# Patient Record
Sex: Male | Born: 1990 | Race: White | Hispanic: No | Marital: Single | Smoking: Current every day smoker
Health system: Southern US, Community
[De-identification: ages and names within clinical notes are randomized; demographics above are authoritative.]

## PROBLEM LIST (undated history)

## (undated) DIAGNOSIS — D62 Acute posthemorrhagic anemia: Secondary | ICD-10-CM

---
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
## 2013-09-18 ENCOUNTER — Inpatient Hospital Stay (HOSPITAL_COMMUNITY)
Admission: EM | Admit: 2013-09-18 | Discharge: 2013-09-21 | DRG: 037 | Disposition: A | Discharge status: Home / Self Care | Payer: PRIVATE HEALTH INSURANCE | Attending: Surgery | Admitting: Surgery

## 2013-09-18 ENCOUNTER — Encounter (HOSPITAL_COMMUNITY): Payer: Self-pay | Admitting: Emergency Medicine

## 2013-09-18 DIAGNOSIS — S45119A Laceration of brachial artery, unspecified side, initial encounter: Secondary | ICD-10-CM

## 2013-09-18 DIAGNOSIS — S4450XA Injury of cutaneous sensory nerve at shoulder and upper arm level, unspecified arm, initial encounter: Principal | ICD-10-CM | POA: Diagnosis present

## 2013-09-18 DIAGNOSIS — IMO0002 Reserved for concepts with insufficient information to code with codable children: Secondary | ICD-10-CM | POA: Diagnosis present

## 2013-09-18 DIAGNOSIS — W268XXA Contact with other sharp object(s), not elsewhere classified, initial encounter: Secondary | ICD-10-CM | POA: Diagnosis present

## 2013-09-18 DIAGNOSIS — S41119A Laceration without foreign body of unspecified upper arm, initial encounter: Secondary | ICD-10-CM

## 2013-09-18 DIAGNOSIS — S41111A Laceration without foreign body of right upper arm, initial encounter: Secondary | ICD-10-CM

## 2013-09-18 DIAGNOSIS — D62 Acute posthemorrhagic anemia: Secondary | ICD-10-CM | POA: Diagnosis present

## 2013-09-18 DIAGNOSIS — Y92009 Unspecified place in unspecified non-institutional (private) residence as the place of occurrence of the external cause: Secondary | ICD-10-CM

## 2013-09-18 DIAGNOSIS — T794XXA Traumatic shock, initial encounter: Secondary | ICD-10-CM | POA: Diagnosis present

## 2013-09-18 DIAGNOSIS — S51809A Unspecified open wound of unspecified forearm, initial encounter: Secondary | ICD-10-CM | POA: Diagnosis present

## 2013-09-18 DIAGNOSIS — F17200 Nicotine dependence, unspecified, uncomplicated: Secondary | ICD-10-CM | POA: Diagnosis present

## 2013-09-18 DIAGNOSIS — D500 Iron deficiency anemia secondary to blood loss (chronic): Secondary | ICD-10-CM | POA: Diagnosis present

## 2013-09-18 HISTORY — DX: Acute posthemorrhagic anemia: D62

## 2013-09-18 NOTE — ED Notes (Signed)
Per ems-- pt put arm through glass door by accident. Pt with 3 large lacerations to R arm. Bleeding controlled with tourniquet.  Pt admits to heavy etoh today. Initial bp 70 palpated. 750cc NS administered pta

## 2013-09-19 ENCOUNTER — Emergency Department (HOSPITAL_COMMUNITY): Payer: Self-pay

## 2013-09-19 ENCOUNTER — Observation Stay (HOSPITAL_COMMUNITY): Payer: PRIVATE HEALTH INSURANCE | Admitting: Anesthesiology

## 2013-09-19 ENCOUNTER — Emergency Department (HOSPITAL_COMMUNITY): Payer: PRIVATE HEALTH INSURANCE

## 2013-09-19 ENCOUNTER — Encounter (HOSPITAL_COMMUNITY): Payer: Self-pay | Admitting: Anesthesiology

## 2013-09-19 ENCOUNTER — Encounter (HOSPITAL_COMMUNITY)
Admission: EM | Disposition: A | Discharge status: Home / Self Care | Payer: PRIVATE HEALTH INSURANCE | Source: Home / Self Care

## 2013-09-19 DIAGNOSIS — S45909A Unspecified injury of unspecified blood vessel at shoulder and upper arm level, unspecified arm, initial encounter: Secondary | ICD-10-CM

## 2013-09-19 DIAGNOSIS — S6990XA Unspecified injury of unspecified wrist, hand and finger(s), initial encounter: Secondary | ICD-10-CM

## 2013-09-19 DIAGNOSIS — IMO0002 Reserved for concepts with insufficient information to code with codable children: Secondary | ICD-10-CM

## 2013-09-19 DIAGNOSIS — S41119A Laceration without foreign body of unspecified upper arm, initial encounter: Secondary | ICD-10-CM

## 2013-09-19 DIAGNOSIS — S59909A Unspecified injury of unspecified elbow, initial encounter: Secondary | ICD-10-CM

## 2013-09-19 DIAGNOSIS — S59919A Unspecified injury of unspecified forearm, initial encounter: Secondary | ICD-10-CM

## 2013-09-19 DIAGNOSIS — S55109A Unspecified injury of radial artery at forearm level, unspecified arm, initial encounter: Secondary | ICD-10-CM

## 2013-09-19 HISTORY — PX: FASCIOTOMY: SHX132

## 2013-09-19 HISTORY — PX: ARTERY REPAIR: SHX559

## 2013-09-19 LAB — PREPARE FRESH FROZEN PLASMA
UNIT DIVISION: 0
Unit division: 0

## 2013-09-19 LAB — CBC
HCT: 29.9 % — ABNORMAL LOW (ref 39.0–52.0)
HEMATOCRIT: 28.8 % — AB (ref 39.0–52.0)
HEMOGLOBIN: 9.8 g/dL — AB (ref 13.0–17.0)
Hemoglobin: 9.8 g/dL — ABNORMAL LOW (ref 13.0–17.0)
MCH: 32 pg (ref 26.0–34.0)
MCH: 33 pg (ref 26.0–34.0)
MCHC: 32.8 g/dL (ref 30.0–36.0)
MCHC: 34 g/dL (ref 30.0–36.0)
MCV: 100.7 fL — ABNORMAL HIGH (ref 78.0–100.0)
MCV: 94.1 fL (ref 78.0–100.0)
PLATELETS: 113 10*3/uL — AB (ref 150–400)
Platelets: 175 10*3/uL (ref 150–400)
RBC: 2.97 MIL/uL — AB (ref 4.22–5.81)
RBC: 3.06 MIL/uL — ABNORMAL LOW (ref 4.22–5.81)
RDW: 12.3 % (ref 11.5–15.5)
RDW: 14.4 % (ref 11.5–15.5)
WBC: 10.2 10*3/uL (ref 4.0–10.5)
WBC: 15.4 10*3/uL — ABNORMAL HIGH (ref 4.0–10.5)

## 2013-09-19 LAB — POCT I-STAT EG7
ACID-BASE DEFICIT: 8 mmol/L — AB (ref 0.0–2.0)
BICARBONATE: 19.6 meq/L — AB (ref 20.0–24.0)
Calcium, Ion: 1.04 mmol/L — ABNORMAL LOW (ref 1.12–1.23)
HCT: 26 % — ABNORMAL LOW (ref 39.0–52.0)
HEMOGLOBIN: 8.8 g/dL — AB (ref 13.0–17.0)
O2 Saturation: 83 %
Patient temperature: 36
Potassium: 4.6 mEq/L (ref 3.7–5.3)
Sodium: 140 mEq/L (ref 137–147)
TCO2: 21 mmol/L (ref 0–100)
pCO2, Ven: 47.4 mmHg (ref 45.0–50.0)
pH, Ven: 7.218 — ABNORMAL LOW (ref 7.250–7.300)
pO2, Ven: 54 mmHg — ABNORMAL HIGH (ref 30.0–45.0)

## 2013-09-19 LAB — BLOOD PRODUCT ORDER (VERBAL) VERIFICATION

## 2013-09-19 LAB — COMPREHENSIVE METABOLIC PANEL
ALT: 13 U/L (ref 0–53)
AST: 18 U/L (ref 0–37)
Albumin: 2.9 g/dL — ABNORMAL LOW (ref 3.5–5.2)
Alkaline Phosphatase: 71 U/L (ref 39–117)
Anion gap: 17 — ABNORMAL HIGH (ref 5–15)
BUN: 9 mg/dL (ref 6–23)
CALCIUM: 7.1 mg/dL — AB (ref 8.4–10.5)
CO2: 19 mEq/L (ref 19–32)
Chloride: 108 mEq/L (ref 96–112)
Creatinine, Ser: 0.9 mg/dL (ref 0.50–1.35)
GFR calc non Af Amer: >90 mL/min (ref >90)
GLUCOSE: 105 mg/dL — AB (ref 70–99)
Potassium: 3.5 mEq/L — ABNORMAL LOW (ref 3.7–5.3)
SODIUM: 144 meq/L (ref 137–147)
Total Bilirubin: <0.2 mg/dL — ABNORMAL LOW (ref 0.3–1.2)
Total Protein: 5.2 g/dL — ABNORMAL LOW (ref 6.0–8.3)

## 2013-09-19 LAB — PROTIME-INR
INR: 1.09 (ref 0.00–1.49)
INR: 1.6 — ABNORMAL HIGH (ref 0.00–1.49)
PROTHROMBIN TIME: 19.1 s — AB (ref 11.6–15.2)
Prothrombin Time: 14.1 seconds (ref 11.6–15.2)

## 2013-09-19 LAB — APTT: aPTT: 33 seconds (ref 24–37)

## 2013-09-19 LAB — PREPARE RBC (CROSSMATCH)

## 2013-09-19 LAB — ETHANOL: ALCOHOL ETHYL (B): 283 mg/dL — AB (ref 0–11)

## 2013-09-19 LAB — MRSA PCR SCREENING: MRSA BY PCR: NEGATIVE

## 2013-09-19 LAB — ABO/RH: ABO/RH(D): O POS

## 2013-09-19 SURGERY — REPAIR, ARTERY, BRACHIAL
Anesthesia: General | Site: Arm Lower | Laterality: Right

## 2013-09-19 MED ORDER — FENTANYL CITRATE 0.05 MG/ML IJ SOLN
INTRAMUSCULAR | Status: AC
  Filled 2013-09-19: qty 5

## 2013-09-19 MED ORDER — DEXTROSE IN LACTATED RINGERS 5 % IV SOLN
INTRAVENOUS | Status: DC
  Administered 2013-09-19: 07:00:00 via INTRAVENOUS

## 2013-09-19 MED ORDER — ARTIFICIAL TEARS OP OINT
TOPICAL_OINTMENT | OPHTHALMIC | Status: DC | PRN
  Administered 2013-09-19: 1 via OPHTHALMIC

## 2013-09-19 MED ORDER — ONDANSETRON HCL 4 MG PO TABS: 4.0000 mg | ORAL_TABLET | Freq: Four times a day (QID) | ORAL | Status: DC | PRN

## 2013-09-19 MED ORDER — SUCCINYLCHOLINE CHLORIDE 20 MG/ML IJ SOLN
INTRAMUSCULAR | Status: DC | PRN
  Administered 2013-09-19: 180 mg via INTRAVENOUS

## 2013-09-19 MED ORDER — ONDANSETRON HCL 4 MG/2ML IJ SOLN
INTRAMUSCULAR | Status: AC
  Filled 2013-09-19: qty 2

## 2013-09-19 MED ORDER — KETOROLAC TROMETHAMINE 30 MG/ML IJ SOLN
30.0000 mg | Freq: Once | INTRAMUSCULAR | Status: AC
  Administered 2013-09-19: 30 mg via INTRAVENOUS
  Filled 2013-09-19: qty 1

## 2013-09-19 MED ORDER — FENTANYL CITRATE 0.05 MG/ML IJ SOLN
INTRAMUSCULAR | Status: DC | PRN
  Administered 2013-09-19 (×5): 50 ug via INTRAVENOUS

## 2013-09-19 MED ORDER — SODIUM CHLORIDE 0.9 % IV SOLN
INTRAVENOUS | Status: AC | PRN
  Administered 2013-09-19: 1000 mL via INTRAVENOUS

## 2013-09-19 MED ORDER — ONDANSETRON HCL 4 MG/2ML IJ SOLN
4.0000 mg | Freq: Four times a day (QID) | INTRAMUSCULAR | Status: DC | PRN
  Administered 2013-09-19 (×2): 4 mg via INTRAVENOUS
  Filled 2013-09-19 (×3): qty 2

## 2013-09-19 MED ORDER — ENOXAPARIN SODIUM 40 MG/0.4ML ~~LOC~~ SOLN
40.0000 mg | SUBCUTANEOUS | Status: DC
  Administered 2013-09-19 – 2013-09-20 (×2): 40 mg via SUBCUTANEOUS
  Filled 2013-09-19 (×4): qty 0.4

## 2013-09-19 MED ORDER — PAPAVERINE HCL 30 MG/ML IJ SOLN
INTRAMUSCULAR | Status: DC | PRN
Start: 2013-09-19 — End: 2013-09-19
  Administered 2013-09-19: 2 mL via INTRAVENOUS

## 2013-09-19 MED ORDER — OXYCODONE HCL 5 MG PO TABS: 5.0000 mg | ORAL_TABLET | Freq: Once | ORAL | Status: DC | PRN

## 2013-09-19 MED ORDER — HYDROMORPHONE HCL PF 1 MG/ML IJ SOLN: 0.2500 mg | INTRAMUSCULAR | Status: DC | PRN

## 2013-09-19 MED ORDER — PHENYLEPHRINE HCL 10 MG/ML IJ SOLN
INTRAMUSCULAR | Status: DC | PRN
  Administered 2013-09-19 (×7): 40 ug via INTRAVENOUS

## 2013-09-19 MED ORDER — HYDROMORPHONE HCL PF 1 MG/ML IJ SOLN
1.0000 mg | INTRAMUSCULAR | Status: DC | PRN
  Administered 2013-09-19: 2 mg via INTRAVENOUS
  Administered 2013-09-21: 1 mg via INTRAVENOUS
  Filled 2013-09-19 (×2): qty 1
  Filled 2013-09-19: qty 2

## 2013-09-19 MED ORDER — HYDROMORPHONE HCL PF 1 MG/ML IJ SOLN
1.0000 mg | INTRAMUSCULAR | Status: DC | PRN
  Administered 2013-09-19 (×2): 1 mg via INTRAVENOUS
  Filled 2013-09-19 (×3): qty 1

## 2013-09-19 MED ORDER — ONDANSETRON HCL 4 MG/2ML IJ SOLN: 4.0000 mg | Freq: Once | INTRAMUSCULAR | Status: DC | PRN

## 2013-09-19 MED ORDER — MIDAZOLAM HCL 5 MG/5ML IJ SOLN
INTRAMUSCULAR | Status: DC | PRN
  Administered 2013-09-19: 2 mg via INTRAVENOUS

## 2013-09-19 MED ORDER — SODIUM CHLORIDE 0.9 % IV BOLUS (SEPSIS): 1000.0000 mL | Freq: Once | INTRAVENOUS | Status: DC

## 2013-09-19 MED ORDER — MIDAZOLAM HCL 2 MG/2ML IJ SOLN
INTRAMUSCULAR | Status: AC
  Filled 2013-09-19: qty 2

## 2013-09-19 MED ORDER — ALBUMIN HUMAN 5 % IV SOLN
INTRAVENOUS | Status: DC | PRN
  Administered 2013-09-19 (×2): via INTRAVENOUS

## 2013-09-19 MED ORDER — PROPOFOL 10 MG/ML IV BOLUS
INTRAVENOUS | Status: AC
  Filled 2013-09-19: qty 20

## 2013-09-19 MED ORDER — THROMBIN 20000 UNITS EX SOLR
CUTANEOUS | Status: AC
  Filled 2013-09-19: qty 20000

## 2013-09-19 MED ORDER — OXYCODONE-ACETAMINOPHEN 5-325 MG PO TABS
1.0000 | ORAL_TABLET | ORAL | Status: DC | PRN
  Administered 2013-09-19: 2 via ORAL
  Filled 2013-09-19 (×2): qty 2

## 2013-09-19 MED ORDER — LACTATED RINGERS IV SOLN
INTRAVENOUS | Status: DC | PRN
  Administered 2013-09-19 (×3): via INTRAVENOUS

## 2013-09-19 MED ORDER — CEFAZOLIN SODIUM-DEXTROSE 2-3 GM-% IV SOLR
INTRAVENOUS | Status: DC | PRN
  Administered 2013-09-19: 2 g via INTRAVENOUS

## 2013-09-19 MED ORDER — GELATIN ABSORBABLE 100 EX MISC
CUTANEOUS | Status: DC | PRN
  Administered 2013-09-19: 02:00:00 via TOPICAL

## 2013-09-19 MED ORDER — ESMOLOL HCL 10 MG/ML IV SOLN
INTRAVENOUS | Status: DC | PRN
  Administered 2013-09-19: 20 mg via INTRAVENOUS

## 2013-09-19 MED ORDER — SUCCINYLCHOLINE CHLORIDE 20 MG/ML IJ SOLN
INTRAMUSCULAR | Status: AC
  Filled 2013-09-19: qty 1

## 2013-09-19 MED ORDER — OXYCODONE HCL 5 MG/5ML PO SOLN: 5.0000 mg | Freq: Once | ORAL | Status: DC | PRN

## 2013-09-19 MED ORDER — ACETAMINOPHEN 325 MG PO TABS
650.0000 mg | ORAL_TABLET | Freq: Four times a day (QID) | ORAL | Status: DC | PRN
  Administered 2013-09-19 – 2013-09-20 (×3): 650 mg via ORAL
  Filled 2013-09-19 (×2): qty 2

## 2013-09-19 MED ORDER — PAPAVERINE HCL 30 MG/ML IJ SOLN
INTRAMUSCULAR | Status: AC
  Filled 2013-09-19: qty 2

## 2013-09-19 MED ORDER — SODIUM CHLORIDE 0.9 % IV SOLN
INTRAVENOUS | Status: DC | PRN
  Administered 2013-09-19: 02:00:00 via INTRAVENOUS

## 2013-09-19 MED ORDER — CEFAZOLIN SODIUM-DEXTROSE 2-3 GM-% IV SOLR
INTRAVENOUS | Status: AC
  Filled 2013-09-19: qty 50

## 2013-09-19 MED ORDER — ALBUTEROL SULFATE HFA 108 (90 BASE) MCG/ACT IN AERS
INHALATION_SPRAY | RESPIRATORY_TRACT | Status: DC | PRN
  Administered 2013-09-19 (×2): 2 via RESPIRATORY_TRACT

## 2013-09-19 MED ORDER — SODIUM CHLORIDE 0.9 % IV SOLN
INTRAVENOUS | Status: AC | PRN
  Administered 2013-09-19 (×2): 1000 mL via INTRAVENOUS

## 2013-09-19 MED ORDER — SODIUM CHLORIDE 0.9 % IR SOLN
Status: DC | PRN
  Administered 2013-09-19: 02:00:00

## 2013-09-19 MED ORDER — SODIUM CHLORIDE 0.9 % IJ SOLN
INTRAMUSCULAR | Status: AC
  Filled 2013-09-19: qty 10

## 2013-09-19 MED ORDER — OXYCODONE HCL 5 MG PO TABS
5.0000 mg | ORAL_TABLET | ORAL | Status: DC | PRN
  Administered 2013-09-19: 5 mg via ORAL
  Administered 2013-09-19 (×2): 10 mg via ORAL
  Administered 2013-09-20: 5 mg via ORAL
  Administered 2013-09-21: 10 mg via ORAL
  Filled 2013-09-19: qty 2
  Filled 2013-09-19: qty 1
  Filled 2013-09-19 (×3): qty 2

## 2013-09-19 MED ORDER — ESMOLOL HCL 10 MG/ML IV SOLN
INTRAVENOUS | Status: AC
  Filled 2013-09-19: qty 10

## 2013-09-19 MED ORDER — ONDANSETRON HCL 4 MG/2ML IJ SOLN
INTRAMUSCULAR | Status: DC | PRN
  Administered 2013-09-19: 4 mg via INTRAVENOUS

## 2013-09-19 MED ORDER — BIOTENE DRY MOUTH MT LIQD
15.0000 mL | Freq: Two times a day (BID) | OROMUCOSAL | Status: DC
  Administered 2013-09-19 – 2013-09-20 (×2): 15 mL via OROMUCOSAL

## 2013-09-19 MED ORDER — SODIUM CHLORIDE 0.9 % IR SOLN
Status: DC | PRN
Start: 2013-09-19 — End: 2013-09-19
  Administered 2013-09-19: 1000 mL

## 2013-09-19 MED ORDER — ETOMIDATE 2 MG/ML IV SOLN
INTRAVENOUS | Status: DC | PRN
  Administered 2013-09-19: 18 mg via INTRAVENOUS

## 2013-09-19 MED ORDER — KETOROLAC TROMETHAMINE 15 MG/ML IJ SOLN
15.0000 mg | Freq: Four times a day (QID) | INTRAMUSCULAR | Status: DC
  Administered 2013-09-19 – 2013-09-20 (×6): 15 mg via INTRAVENOUS
  Filled 2013-09-19 (×16): qty 1

## 2013-09-19 MED ORDER — LIDOCAINE HCL (CARDIAC) 20 MG/ML IV SOLN
INTRAVENOUS | Status: AC
  Filled 2013-09-19: qty 5

## 2013-09-19 MED ORDER — PHENYLEPHRINE 40 MCG/ML (10ML) SYRINGE FOR IV PUSH (FOR BLOOD PRESSURE SUPPORT)
PREFILLED_SYRINGE | INTRAVENOUS | Status: AC
  Filled 2013-09-19: qty 10

## 2013-09-19 MED ORDER — ETOMIDATE 2 MG/ML IV SOLN
INTRAVENOUS | Status: AC
  Filled 2013-09-19: qty 10

## 2013-09-19 SURGICAL SUPPLY — 62 items
BANDAGE ELASTIC 4 VELCRO ST LF (GAUZE/BANDAGES/DRESSINGS) ×4 IMPLANT
BLADE 10 SAFETY STRL DISP (BLADE) ×4 IMPLANT
BNDG ESMARK 4X9 LF (GAUZE/BANDAGES/DRESSINGS) IMPLANT
BNDG GAUZE ELAST 4 BULKY (GAUZE/BANDAGES/DRESSINGS) ×4 IMPLANT
CANISTER SUCTION 2500CC (MISCELLANEOUS) ×4 IMPLANT
CATH EMB 2FR 60CM (CATHETERS) ×4 IMPLANT
CLIP TI MEDIUM 6 (CLIP) ×4 IMPLANT
CLIP TI WIDE RED SMALL 6 (CLIP) ×8 IMPLANT
COVER SURGICAL LIGHT HANDLE (MISCELLANEOUS) ×4 IMPLANT
CUFF TOURNIQUET SINGLE 18IN (TOURNIQUET CUFF) IMPLANT
CUFF TOURNIQUET SINGLE 24IN (TOURNIQUET CUFF) IMPLANT
DECANTER SPIKE VIAL GLASS SM (MISCELLANEOUS) IMPLANT
DERMABOND ADVANCED (GAUZE/BANDAGES/DRESSINGS) ×2
DERMABOND ADVANCED .7 DNX12 (GAUZE/BANDAGES/DRESSINGS) ×2 IMPLANT
DRAPE ORTHO SPLIT 77X108 STRL (DRAPES) ×2
DRAPE SURG ORHT 6 SPLT 77X108 (DRAPES) ×2 IMPLANT
DRSG PAD ABDOMINAL 8X10 ST (GAUZE/BANDAGES/DRESSINGS) ×4 IMPLANT
ELECT REM PT RETURN 9FT ADLT (ELECTROSURGICAL) ×4
ELECTRODE REM PT RTRN 9FT ADLT (ELECTROSURGICAL) ×2 IMPLANT
GAUZE SPONGE 4X4 16PLY XRAY LF (GAUZE/BANDAGES/DRESSINGS) ×4 IMPLANT
GAUZE XEROFORM 5X9 LF (GAUZE/BANDAGES/DRESSINGS) ×4 IMPLANT
GLOVE BIO SURGEON STRL SZ7 (GLOVE) ×8 IMPLANT
GLOVE BIO SURGEON STRL SZ7.5 (GLOVE) ×8 IMPLANT
GLOVE BIOGEL PI IND STRL 7.5 (GLOVE) ×6 IMPLANT
GLOVE BIOGEL PI IND STRL 8 (GLOVE) ×2 IMPLANT
GLOVE BIOGEL PI INDICATOR 7.5 (GLOVE) ×6
GLOVE BIOGEL PI INDICATOR 8 (GLOVE) ×2
GLOVE BIOGEL PI ORTHO PRO SZ8 (GLOVE) ×2
GLOVE PI ORTHO PRO STRL SZ8 (GLOVE) ×2 IMPLANT
GOWN STRL REUS W/ TWL LRG LVL3 (GOWN DISPOSABLE) ×6 IMPLANT
GOWN STRL REUS W/TWL LRG LVL3 (GOWN DISPOSABLE) ×6
KIT BASIN OR (CUSTOM PROCEDURE TRAY) ×4 IMPLANT
KIT ROOM TURNOVER OR (KITS) ×4 IMPLANT
NS IRRIG 1000ML POUR BTL (IV SOLUTION) ×4 IMPLANT
PACK CV ACCESS (CUSTOM PROCEDURE TRAY) ×4 IMPLANT
PACK UNIVERSAL I (CUSTOM PROCEDURE TRAY) ×4 IMPLANT
PAD ARMBOARD 7.5X6 YLW CONV (MISCELLANEOUS) ×8 IMPLANT
PAD CAST 4YDX4 CTTN HI CHSV (CAST SUPPLIES) ×2 IMPLANT
PADDING CAST COTTON 4X4 STRL (CAST SUPPLIES) ×2
SPLINT PLASTER CAST XFAST 5X30 (CAST SUPPLIES) ×2 IMPLANT
SPLINT PLASTER XFAST SET 5X30 (CAST SUPPLIES) ×2
SPONGE GAUZE 4X4 12PLY (GAUZE/BANDAGES/DRESSINGS) ×4 IMPLANT
SPONGE LAP 18X18 X RAY DECT (DISPOSABLE) ×4 IMPLANT
SPONGE SURGIFOAM ABS GEL 100 (HEMOSTASIS) IMPLANT
STAPLER VISISTAT 35W (STAPLE) ×16 IMPLANT
SUT ETHILON 3 0 PS 1 (SUTURE) ×12 IMPLANT
SUT ETHILON 8 0 BV130 4 (SUTURE) ×4 IMPLANT
SUT MNCRL AB 4-0 PS2 18 (SUTURE) ×4 IMPLANT
SUT PROLENE 6 0 BV (SUTURE) ×4 IMPLANT
SUT PROLENE 7 0 BV1 MDA (SUTURE) ×4 IMPLANT
SUT SILK 3 0 SH 30 (SUTURE) ×4 IMPLANT
SUT VIC AB 2-0 CT1 27 (SUTURE) ×2
SUT VIC AB 2-0 CT1 TAPERPNT 27 (SUTURE) ×2 IMPLANT
SUT VIC AB 2-0 SH 27 (SUTURE) ×10
SUT VIC AB 2-0 SH 27XBRD (SUTURE) ×10 IMPLANT
SUT VIC AB 3-0 SH 27 (SUTURE) ×2
SUT VIC AB 3-0 SH 27X BRD (SUTURE) ×2 IMPLANT
SYR TB 1ML LUER SLIP (SYRINGE) ×4 IMPLANT
TOWEL OR 17X24 6PK STRL BLUE (TOWEL DISPOSABLE) ×8 IMPLANT
TOWEL OR 17X26 10 PK STRL BLUE (TOWEL DISPOSABLE) ×4 IMPLANT
UNDERPAD 30X30 INCONTINENT (UNDERPADS AND DIAPERS) ×4 IMPLANT
WATER STERILE IRR 1000ML POUR (IV SOLUTION) ×4 IMPLANT

## 2013-09-19 NOTE — Op Note (Addendum)
OPERATIVE NOTE   PROCEDURE: 1. Repair of right brachial artery with interposition graft of reversed cephalic vein 2. Ligation of right cephalic vein, brachial veins, and basilic vein 3. Patch angioplasty of radial artery with cephalic vein 4. Complex repair of lacerations of right arm (~10 cm)  PRE-OPERATIVE DIAGNOSIS: traumatic laceration of antecubitum, hemorrhagic shock  POST-OPERATIVE DIAGNOSIS: same as above   SURGEON: Leonides Sake, MD  ASSISTANT(S): Betha Loa, MD  ANESTHESIA: general  ESTIMATED BLOOD LOSS: 200 cc  FINDING(S): 1. Transection of brachial artery and veins 2. Partial transection of anterior wall of radial artery 3. Transection of cephalic and basilic veins 4. Transection of lateral cutaneous nerve 5. Intact collateral flow in forearm 6. Faintly palpable radial pulse at end of case 7. Dopplerable ulnar artery signal 8. Dopplerable palmar arch  SPECIMEN(S):  none  INDICATIONS:   [name] is a 23 y.o. male who presents with traumatic injury to right antecubitum after reportedly putting his arm through a glass door.  He reported was bleeding >500 mL in the ambulance and a tourniquet was applied.  The patient was brought back to the operating room emergently for a right arm exploration and possible repair of vessels, nerves, and arteries.  The patient is aware the risks of this procedure include but are not limited to:  bleeding, infection, inability to reconstruct arteries, nerves, and veins, and need for additional procedures in the future.  The patient elected to proceed forward with this case..  DESCRIPTION: After obtaining full informed written consent, the patient was brought back to the operating room.  Due to continued bleeding, I removed the EMS placed tourniquet and place a non-sterile tourniquet on the upper arm.  I inflated the tourniquet to 250 mm Hg, obtaining hemostasis in the right arm.  The patient was then placed supine upon the operating  table.  The patient received IV antibiotics prior to induction.  After obtaining adequate anesthesia, the patient was prepped and draped in the standard fashion for: right arm exploration.  This was a joint procedure with Dr. Merlyn Lot, Hand Surgery.  His portion of his case is dictated in his note.  I started by irrigating out all of the lacerations.  There was a deep laceration slightly proximal to the elbow joint that extended down to his bone.  There was also a more shallow laceration on the lateral surface which only extended down to the muscle.  There also a shallow laceration on the medial forearm which was only down to subcutaneous tissue.  Finally there was a forearm laceration which appeared to extend down to the forearm vessels.  I made an incision over the brachial artery proximal to the antecubital laceration.  I dissected out the transected brachial artery.   In the process, I identified two transected brachial veins, a transected cephalic vein, and a transection basilic vein.  I also dissected out the distal extent of the transected brachial artery.  Despite mobilization of the two ends of this artery, I did not think it could be repaired tension free without an interposition graft of vein.  The cephalic vein appeared to be larger in caliber so I elected to use this vein as the interposition graft.  I harvested a 10 cm length of cephalic vein from this arm as the vein was already transected.  I sharply freshen both ends of this vein conduit.  I inserted a vessel cannula and hydrodistended this vein segment.  It appeared to be 4-5 mm in diameter  without any evidence of sclerosis.  I elected to use this segment in a reversed configuration.  I sharply transected the proximal end of the brachial artery at an uninjured segment.  I spatulated the proximal brachial artery and the distal end of the cephalic vein conduit to facilitate an end-to-end anastomosis.  The vein was sewn to the brachial artery with a  running stitch of 7-0 Prolene.  Distally, I also transected the brachial artery at an uninjured segment.  I stretched the vein and the artery.  The entire residual length ~7 cm of conduit appeared to be necessary due to retraction of the artery.  I spatulated the proximal end of the cephalic vein conduit and the distal brachial artery to facilitate an end-to-end anastomosis.  I sewed the vein to the artery with a running stitch of 7-0 Prolene.  Prior to completing this anastomosis, I passed a 2 Fogarty proximally and distally: no thrombus was present.  The tourniquet was dropped and no clot was noted from either end of the distal anastomosis.  I completed this anastomosis in the usual fashion.  Immediately arterial bleeding from a forearm radial artery injury was was noted.  I reinflated the tourniquet and turned my attention to the forearm.  The forearm laceration was washed out again.  I dissected out the radial artery in the forearm laceration.  I could identify a transection of the anterior wall of the radial artery.  The lumen of this artery did not appear adequate to do a primary repair of the radial artery, so I took some the remaining cephalic vein and opened it longitudinally.  I cut the vein to a geometry compatible with the arterial injury.  I sewed the vein patch onto the radial artery with a running stitch of 7-0 Prolene.  Prior to completing this patch angioplasty, I backbled both ends of the radial artery.  I completed the patch angioplasty in the usual fashion.  I released all clamps and the tourniquet.  Immediately there was a palpable radial pulse.  I could also doppler an ulnar artery signal and a palmar arch signal.  I sequentially ligated all visible transected vein segments at this point, as there was inadequate length to repair any of them.    Dr. Merlyn Lot had been repairing the muscles and tendons and had started fasciotomies in this forearm, as documented in his operative note.  At this  point, I helped Dr. Merlyn Lot with completion of his ulnar and volar fasciotomies of the forearm.  I also assisted his nerve repair.  We washed out all incisions and placed thrombin and gelfoam.  After waiting 5 minutes, no further active bleeding was present.  We both reapproximated the subcutaneous tissue in the forearm with 3-0 Vicryl stitches.  The skin was reapproximated at critical junctures with 3-0 Nylon stitches.  The remaining skin was reapproximated with staples except for the short shallow laceration on the medial surface of the forearm which appeared somewhat contaminated.  The arm was washed off.  Xeroform gauze was applied to the staple line.  Sterile gauze and abdominal pads were applied to the right arm.  The arm was wrapped with a Kerlix from and to upper arm.  At this point, Dr. Merlyn Lot constructed a cushioned splint from the upper arm down to the wrist.  This was secured with a Kerlix and ACE wrap.  As the wrist was covered now, the wrist pulses could not be palpated.  Consequently, I marked a point of insonation in  the palmar arch.  COMPLICATIONS: none  CONDITION: guarded  Leonides SakeBrian Audryanna Zurita, MD [facility] Office: [phone] Pager: [phone]  09/19/2013, 4:15 AM

## 2013-09-19 NOTE — Progress Notes (Signed)
Attempt x1 to get report, RN off floor, will attempt again.

## 2013-09-19 NOTE — Brief Op Note (Signed)
09/18/2013 - 09/19/2013  4:21 AM  PATIENT:  [name]  23 y.o. male  PRE-OPERATIVE DIAGNOSIS:  Laceration right arm  POST-OPERATIVE DIAGNOSIS:  x2 Laceration right arm.  PROCEDURE:  Procedure(s) with comments: BRACHIAL ARTERY REPAIR (Right) - Brachial artery repair with interpositional graft and patch angioplasty. FASCIOTOMY (Right) - repair of cutaneous nerve and repair of right arm muscles, and fasciotomy x2 performed by Dr. Merlyn LotKuzma.  SURGEON:  Surgeon(s) and Role:    * Fransisco HertzBrian L Chen, MD - Primary Arterial repair    * Tami RibasKevin R Tamer Baughman, MD - Primary fasciotomy, muscle repair, nerve repair  PHYSICIAN ASSISTANT:   ASSISTANTS: none   ANESTHESIA:   general  EBL:  Total I/O In: 6010 [I.V.:4250; Blood:1260; IV Piggyback:500] Out: -   BLOOD ADMINISTERED:none  DRAINS: none   LOCAL MEDICATIONS USED:  NONE  SPECIMEN:  No Specimen  DISPOSITION OF SPECIMEN:  N/A  COUNTS:  YES  TOURNIQUET:   Total Tourniquet Time Documented: Upper Arm (Right) - 23 minutes Upper Arm (Right) - 66 minutes Total: Upper Arm (Right) - 89 minutes   DICTATION: .Other Dictation: Dictation Number [id number]  PLAN OF CARE: Discharge to home after PACU  PATIENT DISPOSITION:  PACU - hemodynamically stable.

## 2013-09-19 NOTE — Anesthesia Procedure Notes (Signed)
Procedure Name: Intubation Date/Time: 09/19/2013 12:53 AM Performed by: Luster LandsbergHASE, Lear Carstens R Pre-anesthesia Checklist: Patient identified, Emergency Drugs available, Suction available and Patient being monitored Patient Re-evaluated:Patient Re-evaluated prior to inductionOxygen Delivery Method: Circle system utilized Preoxygenation: Pre-oxygenation with 100% oxygen Intubation Type: IV induction, Rapid sequence and Cricoid Pressure applied Laryngoscope Size: Mac and 3 Grade View: Grade I Tube type: Oral Tube size: 7.5 mm Number of attempts: 1 Airway Equipment and Method: Stylet Placement Confirmation: ETT inserted through vocal cords under direct vision,  positive ETCO2 and breath sounds checked- equal and bilateral Secured at: 22 cm Tube secured with: Tape Dental Injury: Teeth and Oropharynx as per pre-operative assessment

## 2013-09-19 NOTE — Progress Notes (Signed)
   Palmar artery doppler signal intact.  He has increased AROM of the fingers, sensation not intact.  Senita Corredor MAUREEN PA-C

## 2013-09-19 NOTE — Progress Notes (Signed)
Trauma Service Note  Subjective: Lots of pain in right arm.  Objective: Vital signs in last 24 hours: Temp:  [97.4 F (36.3 C)-99.3 F (37.4 C)] 99.2 F (37.3 C) (07/17 0727) Pulse Rate:  [72-114] 75 (07/17 1000) Resp:  [8-24] 24 (07/17 1000) BP: (82-128)/(53-74) 117/60 mmHg (07/17 1000) SpO2:  [96 %-100 %] 96 % (07/17 1000) Weight:  [68.04 kg (150 lb)] 68.04 kg (150 lb) (07/16 2353) Last BM Date: 09/18/13  Intake/Output from previous day: 07/16 0701 - 07/17 0700 In: 7057.5 [I.V.:5285; Blood:1272.5; IV Piggyback:500] Out: 1250 [Urine:1000; Blood:250] Intake/Output this shift: Total I/O In: 495 [I.V.:300; Blood:195] Out: -   General: Moderated acute distress  Lungs: Clear  Abd: Benign  Extremities: Re-wrapped RUE.  Some bleeding.  Good arch doppler signal in right hand.  Neuro: Intact  Lab Results: CBC   Recent Labs  09/19/13 0015 09/19/13 0329 09/19/13 0330  WBC 10.2  --  15.4*  HGB 9.8* 8.8* 9.8*  HCT 29.9* 26.0* 28.8*  PLT 175  --  113*   BMET  Recent Labs  09/19/13 0015 09/19/13 0329  NA 144 140  K 3.5* 4.6  CL 108  --   CO2 19  --   GLUCOSE 105*  --   BUN 9  --   CREATININE 0.90  --   CALCIUM 7.1*  --    PT/INR  Recent Labs  09/19/13 0015 09/19/13 0330  LABPROT 14.1 19.1*  INR 1.09 1.60*   ABG  Recent Labs  09/19/13 0329  HCO3 19.6*    Studies/Results: Dg Elbow 2 Views Right  09/19/2013   CLINICAL DATA:  Arterial bleeding from the right arm after trauma.  EXAM: RIGHT ELBOW - 1 VIEW  COMPARISON:  None.  FINDINGS: Extensive subcutaneous gas in the forearm and distal arm, with overlying bandage. There is no radiopaque foreign body, dislocation or fracture in the frontal projection.  IMPRESSION: No fracture or definite foreign body in the frontal projection.   Electronically Signed   By: Tiburcio PeaJonathan  Watts M.D.   On: 09/19/2013 00:36    Anti-infectives: Anti-infectives   None      Assessment/Plan: s/p Procedure(s): BRACHIAL  ARTERY REPAIR FASCIOTOMY Advance diet Transfer to floor  LOS: 1 day   Marta LamasJames O. Gae BonWyatt, III, MD, FACS [phone] Trauma Surgeon 09/19/2013

## 2013-09-19 NOTE — Transfer of Care (Signed)
Immediate Anesthesia Transfer of Care Note  Patient: [name]  Procedure(s) Performed: Procedure(s) with comments: BRACHIAL ARTERY REPAIR (Right) - Brachial artery repair with interpositional graft and patch angioplasty. FASCIOTOMY (Right) - repair of cutaneous nerve and repair of right arm muscles, and fasciotomy x2 performed by Dr. Merlyn LotKuzma.  Patient Location: PACU  Anesthesia Type:General  Level of Consciousness: responds to stimulation  Airway & Oxygen Therapy: Patient Spontanous Breathing and Patient connected to nasal cannula oxygen  Post-op Assessment: Report given to PACU RN, Post -op Vital signs reviewed and stable and Patient moving all extremities  Post vital signs: Reviewed and stable  Complications: No apparent anesthesia complications

## 2013-09-19 NOTE — H&P (Signed)
History   [name] is an 23 y.o. male.   Chief Complaint:  Chief Complaint  Patient presents with  . Trauma   Put right arm through glass door.  Pulsatile bleeding at scene. Tournequet placed at 2325 at scene.  Hypotensive into the 27 's.  No other injuries. Cannot move or feel hand. Received 750 cc in field.   Trauma Injury location: shoulder/arm Injury location detail: R elbow Incident location: home    History reviewed. No pertinent past medical history.  History reviewed. No pertinent past surgical history.  No family history on file. Social History:  reports that he has been smoking.  He does not have any smokeless tobacco history on file. He reports that he drinks alcohol. He reports that he does not use illicit drugs.  Allergies  No Known Allergies  Home Medications   (Not in a hospital admission)  Trauma Course   Results for orders placed during the hospital encounter of 09/18/13 (from the past 48 hour(s))  TYPE AND SCREEN     Status: None   Collection Time    09/18/13 11:43 PM      Result Value Ref Range   ABO/RH(D) PENDING     Antibody Screen PENDING     Sample Expiration 09/21/2013     Unit Number [id number]     Blood Component Type RED CELLS,LR     Unit division 00     Status of Unit ISSUED     Unit tag comment VERBAL ORDERS PER DR OTTER     Transfusion Status OK TO TRANSFUSE     Crossmatch Result PENDING     Unit Number [id number]     Blood Component Type RED CELLS,LR     Unit division 00     Status of Unit ISSUED     Unit tag comment VERBAL ORDERS PER DR OTTER     Transfusion Status OK TO TRANSFUSE     Crossmatch Result PENDING    PREPARE FRESH FROZEN PLASMA     Status: None   Collection Time    09/18/13 11:43 PM      Result Value Ref Range   Unit Number [id number]     Blood Component Type THAWED PLASMA     Unit division 00     Status of Unit ISSUED     Unit tag comment VERBAL ORDERS PER DR OTTER     Transfusion Status OK TO  TRANSFUSE     Unit Number [id number]     Blood Component Type THAWED PLASMA     Unit division 00     Status of Unit ISSUED     Unit tag comment VERBAL ORDERS PER DR OTTER     Transfusion Status OK TO TRANSFUSE     No results found.  Review of Systems  Constitutional: Negative.   HENT: Negative.   Eyes: Negative.   Cardiovascular: Negative.   Gastrointestinal: Negative.   Genitourinary: Negative.   Musculoskeletal: Negative.   Skin: Negative.   Neurological: Negative.   Endo/Heme/Allergies: Negative.   Psychiatric/Behavioral: Negative.     Blood pressure 82/60, pulse 72, temperature 97.4 F (36.3 C), temperature source Oral, resp. rate 19, height 6' (1.829 m), weight 150 lb (68.04 kg), SpO2 100.00%. Physical Exam  Constitutional: He appears well-developed and well-nourished.  HENT:  Head: Normocephalic.  Mouth/Throat: No oropharyngeal exudate.  Eyes: Pupils are equal, round, and reactive to light. No scleral icterus.  Neck: Normal range of motion. Neck supple.  Cardiovascular: Normal  rate and regular rhythm.   Pulses:      Radial pulses are 0 on the right side, and 3+ on the left side.  Respiratory: Effort normal and breath sounds normal.  GI: Soft. Bowel sounds are normal. There is no tenderness.  Neurological: He is alert. A sensory deficit is present. GCS eye subscore is 4. GCS verbal subscore is 5. GCS motor subscore is 6.  Skin: He is diaphoretic.        Assessment/Plan Right arm laceration at elbow with loss of sensation and pulsatile bleeding at antecubital fossa. Hand and vascular consulted Will need exploration by specialist resusitation  With saline and blood products as needed.   Shannelle Alguire A. 09/19/2013, 12:12 AM   Procedures

## 2013-09-19 NOTE — Anesthesia Preprocedure Evaluation (Signed)
Anesthesia Evaluation  Patient identified by MRN, date of birth, ID band Patient awake    Reviewed: Allergy & Precautions, H&P , NPO status , Patient's Chart, lab work & pertinent test results  Airway Mallampati: I TM Distance: >3 FB Neck ROM: Full    Dental  (+) Teeth Intact, Dental Advisory Given   Pulmonary Current Smoker,  breath sounds clear to auscultation        Cardiovascular Rhythm:Regular Rate:Normal     Neuro/Psych    GI/Hepatic   Endo/Other    Renal/GU      Musculoskeletal   Abdominal   Peds  Hematology   Anesthesia Other Findings Upper dental plate of some kind.  Will remove.  Reproductive/Obstetrics                           Anesthesia Physical Anesthesia Plan  ASA: II and emergent  Anesthesia Plan: General   Post-op Pain Management:    Induction: Intravenous, Rapid sequence and Cricoid pressure planned  Airway Management Planned: Oral ETT  Additional Equipment:   Intra-op Plan:   Post-operative Plan: Extubation in OR  Informed Consent: I have reviewed the patients History and Physical, chart, labs and discussed the procedure including the risks, benefits and alternatives for the proposed anesthesia with the patient or authorized representative who has indicated his/her understanding and acceptance.   Dental advisory given  Plan Discussed with: CRNA, Anesthesiologist and Surgeon  Anesthesia Plan Comments:         Anesthesia Quick Evaluation

## 2013-09-19 NOTE — Consult Note (Signed)
[facility]  [facility]  Reason for referral: hemorrhagic shock from antecubital laceration  History of Present Illness  [name] is a 23 y.o. (1991-01-07) male who presents with chief complaint: acute bleeding.  History is obtained from pt's girlfrend as patient is in shock.  This patient ~11 pm put his right arm through a glass door reported.  This resulted in massive bleeding.  By report, the patient lost >500 cc of blood in transport and a tourniquet was placed ~1 hr to presentation.  In the trauma bay, the patient was hypotensive and reportedly complaining of numbness and weakness in his right arm.   Past Medical History None  Past Surgical History None  History   Social History  . Marital Status: Single    Spouse Name: N/A    Number of Children: N/A  . Years of Education: N/A   Occupational History  . Not on file.   Social History Main Topics  . Smoking status: Current Every Day Smoker  . Smokeless tobacco: Not on file  . Alcohol Use: Yes  . Drug Use: No  . Sexual Activity: Not on file   Other Topics Concern  . Not on file   Social History Narrative  . No narrative on file    Family History: could not be obtained from the patient Current Facility-Administered Medications  Medication Dose Route Frequency Provider Last Rate Last Dose  . dextrose 5 % in lactated ringers infusion   Intravenous Continuous Thomas A. Cornett, MD      . enoxaparin (LOVENOX) injection 40 mg  40 mg Subcutaneous Q24H Thomas A. Cornett, MD      . HYDROmorphone (DILAUDID) injection 0.25-0.5 mg  0.25-0.5 mg Intravenous Q5 min PRN Kerby Noraavid A Crews, MD      . HYDROmorphone (DILAUDID) injection 1 mg  1 mg Intravenous Q2H PRN Thomas A. Cornett, MD      . ondansetron (ZOFRAN) tablet 4 mg  4 mg Oral Q6H PRN Thomas A. Cornett, MD       Or  . ondansetron (ZOFRAN) injection 4 mg  4 mg Intravenous Q6H PRN Thomas A. Cornett, MD      . ondansetron (ZOFRAN) injection 4 mg  4 mg Intravenous Once PRN  Kerby Noraavid A Crews, MD      . oxyCODONE (Oxy IR/ROXICODONE) immediate release tablet 5 mg  5 mg Oral Once PRN Kerby Noraavid A Crews, MD       Or  . oxyCODONE (ROXICODONE) 5 MG/5ML solution 5 mg  5 mg Oral Once PRN Kerby Noraavid A Crews, MD      . sodium chloride 0.9 % bolus 1,000 mL  1,000 mL Intravenous Once Olivia Mackielga M Otter, MD       No current outpatient prescriptions on file.   Facility-Administered Medications Ordered in Other Encounters  Medication Dose Route Frequency Provider Last Rate Last Dose  . albuterol (PROVENTIL HFA;VENTOLIN HFA) 108 (90 BASE) MCG/ACT inhaler    Anesthesia Intra-op Luster Landsbergonja R Chase, CRNA   2 puff at 09/19/13 0418  . esmolol (BREVIBLOC) injection    Anesthesia Intra-op Luster Landsbergonja R Chase, CRNA   20 mg at 09/19/13 0424   No Known Allergies  REVIEW OF SYSTEMS:  (Positives checked otherwise negative)  CARDIOVASCULAR:  []  chest pain, []  chest pressure, []  palpitations, []  shortness of breath when laying flat, []  shortness of breath with exertion,  []  pain in feet when walking, []  pain in feet when laying flat, []  history of blood clot in veins (  DVT), []  history of phlebitis, []  swelling in legs, []  varicose veins  PULMONARY:  []  productive cough, []  asthma, []  wheezing  NEUROLOGIC:  [x]  weakness in arms or legs, [x]  numbness in arms or legs, []  difficulty speaking or slurred speech, []  temporary loss of vision in one eye, []  dizziness  HEMATOLOGIC:  []  bleeding problems, []  problems with blood clotting too easily  MUSCULOSKEL:  []  joint pain, []  joint swelling  GASTROINTEST:  []  vomiting blood, []  blood in stool     GENITOURINARY:  []  burning with urination, []  blood in urine  PSYCHIATRIC:  []  history of major depression  INTEGUMENTARY:  []  rashes, []  ulcers  CONSTITUTIONAL:  []  fever, []  chills   Physical Examination  Filed Vitals:   09/19/13 0003 09/19/13 0018 09/19/13 0022 09/19/13 0028  BP: 82/60 103/68 113/74 120/63  Pulse: 72 73 74 94  Temp:      TempSrc:      Resp: 19  14 14 22   Height:      Weight:      SpO2: 100% 100% 100% 100%    Body mass index is 20.34 kg/(m^2).  General: awake but intermittently responsive, WDWN  Head: [place]/AT  Ear/Nose/Throat: Hearing grossly intact, nares w/o erythema or drainage, oropharynx w/o Erythema/Exudate, Mallampati score: 3  Eyes: PERRLA, EOMI  Neck: Supple, no nuchal rigidity, no palpable LAD  Pulmonary: Sym exp, good air movt, CTAB, no rales, rhonchi, & wheezing  Cardiac: RRR, Nl S1, S2, no Murmurs, rubs or gallops  Vascular: Vessel Right Left  Radial Not Palpable Palpable  Ulnar Not Palpable Palpable  Brachial Not Palpable Palpable  Carotid Palpable, without bruit Palpable, without bruit  Aorta Not palpable N/A  Femoral Palpable Palpable  Popliteal Not palpable Not palpable  PT  Palpable  Palpable  DP  Palpable  Palpable   Gastrointestinal: soft, NTND, -G/R, - HSM, - masses, - CVAT B  Musculoskeletal: limited exam due to shock, R arm cold and pale, tourniquet in place but still bleeding from large antecubital laceration, multiple other lacerations evident in R arm, bloody feet also, moving feet spontaneously  Neurologic: limited exam due to shock, Pain and light touch intact in extremities except numbness in R hand, Motor exam as listed above  Psychiatric: limited exam due to shock  Dermatologic: See M/S exam for extremity exam, no rashes otherwise noted  Lymph : No Cervical, Axillary, or Inguinal lymphadenopathy   Laboratory: CBC:    Component Value Date/Time   WBC 15.4* 09/19/2013 0330   RBC 3.06* 09/19/2013 0330   HGB 9.8* 09/19/2013 0330   HCT 28.8* 09/19/2013 0330   PLT 113* 09/19/2013 0330   MCV 94.1 09/19/2013 0330   MCH 32.0 09/19/2013 0330   MCHC 34.0 09/19/2013 0330   RDW 14.4 09/19/2013 0330    BMP:    Component Value Date/Time   NA 140 09/19/2013 0329   K 4.6 09/19/2013 0329   CL 108 09/19/2013 0015   CO2 19 09/19/2013 0015   GLUCOSE 105* 09/19/2013 0015   BUN 9 09/19/2013 0015    CREATININE 0.90 09/19/2013 0015   CALCIUM 7.1* 09/19/2013 0015   GFRNONAA >90 09/19/2013 0015   GFRAA >90 09/19/2013 0015    Coagulation: Lab Results  Component Value Date   INR 1.60* 09/19/2013   INR 1.09 09/19/2013   No results found for this basename: PTT    Radiology: Dg Elbow 2 Views Right  09/19/2013   CLINICAL DATA:  Arterial bleeding from the right arm  after trauma.  EXAM: RIGHT ELBOW - 1 VIEW  COMPARISON:  None.  FINDINGS: Extensive subcutaneous gas in the forearm and distal arm, with overlying bandage. There is no radiopaque foreign body, dislocation or fracture in the frontal projection.  IMPRESSION: No fracture or definite foreign body in the frontal projection.   Electronically Signed   By: Tiburcio Pea M.D.   On: 09/19/2013 00:36    Medical Decision Making  [name] is a 23 y.o. male who presents with: traumatic laceration of L arm with likely arterial and venous injuries, hemorrhagic shock   Patient is emergently going to operating for Right arm exploration with Dr. Merlyn Lot (Hand Surgery)  I discussed with the patient the need for possible right arm arterial and venous reconstruction vs. Ligation.  We discussed the risks included but are not limited to: bleeding, infection, inability to complete the vascular reconstruction, possible need for amputation, and possible need for further procedures.  The patient has already signed his consent.    Leonides Sake, MD [facility] Office: [phone] Pager: [phone]  09/19/2013, 12:47 AM

## 2013-09-19 NOTE — Progress Notes (Signed)
Order to keep right arm in sling. Sling has been order, waiting for arrival to the unit. Until then the right arm has been placed on two pillows. Will continue to monitor.

## 2013-09-19 NOTE — Anesthesia Postprocedure Evaluation (Signed)
  Anesthesia Post-op Note  Patient: [name]  Procedure(s) Performed: Procedure(s) with comments: BRACHIAL ARTERY REPAIR (Right) - Brachial artery repair with interpositional graft and patch angioplasty. FASCIOTOMY (Right) - repair of cutaneous nerve and repair of right arm muscles, and fasciotomy x2 performed by Dr. Merlyn LotKuzma.  Patient Location: PACU  Anesthesia Type: General   Level of Consciousness: awake, alert  and oriented  Airway and Oxygen Therapy: Patient Spontanous Breathing with face mask  Post-op Pain: mild  Post-op Assessment: Post-op Vital signs reviewed  Post-op Vital Signs: Reviewed  Last Vitals:  Filed Vitals:   09/19/13 0545  BP: 108/62  Pulse: 104  Temp: 37.2 C  Resp: 16    Complications: No apparent anesthesia complications

## 2013-09-19 NOTE — Progress Notes (Signed)
     Subjective  - "I'm in a lot of pain."   Objective 111/66 100 99.3 F (37.4 C) (Oral) 19 100%  Intake/Output Summary (Last 24 hours) at 09/19/13 [id number] Last data filed at 09/19/13 [id number]  Gross per 24 hour  Intake 7022.5 ml  Output   1250 ml  Net 5772.5 ml    Right arm long dressing in place C/D Min. Active range of motion, fingers warm to touch. Sensation not intact  Assessment/Planning: POD #0 PROCEDURE:  1. Repair of right brachial artery with interposition graft of reversed cephalic vein 2. Ligation of right cephalic vein, brachial veins, and basilic vein 3. Patch angioplasty of radial artery with cephalic vein 4. Complex repair of lacerations of right arm (~10 cm)    Hitesh Fouche [name] 09/19/2013 7:12 AM --  Laboratory Lab Results:  Recent Labs  09/19/13 0015 09/19/13 0329 09/19/13 0330  WBC 10.2  --  15.4*  HGB 9.8* 8.8* 9.8*  HCT 29.9* 26.0* 28.8*  PLT 175  --  113*   BMET  Recent Labs  09/19/13 0015 09/19/13 0329  NA 144 140  K 3.5* 4.6  CL 108  --   CO2 19  --   GLUCOSE 105*  --   BUN 9  --   CREATININE 0.90  --   CALCIUM 7.1*  --     COAG Lab Results  Component Value Date   INR 1.60* 09/19/2013   INR 1.09 09/19/2013   No results found for this basename: PTT

## 2013-09-19 NOTE — Op Note (Signed)
[name], [name]                 ACCOUNT NO.:  [id number]  MEDICAL RECORD NO.:  [id number]  LOCATION:  3M10C                        FACILITY:  [facility]  PHYSICIAN:  Betha LoaKevin Tylisa Alcivar, MD        DATE OF BIRTH:  11-18-1990  DATE OF PROCEDURE:  09/18/2013 DATE OF DISCHARGE:                              OPERATIVE REPORT   PREOPERATIVE DIAGNOSIS:  Right arm and forearm lacerations with arterial injury, nerve injury, and muscle lacerations.  POSTOPERATIVE DIAGNOSIS:  Right arm and forearm lacerations with brachial artery injury, radial artery injury, cutaneous branch of lateral antebrachial cutaneous nerve laceration, brachialis lacerations, biceps laceration, brachioradialis laceration, and flexor carpi radialis laceration.  PROCEDURES:   1. Repair of right biceps muscle and tendon 2. Repair of right brachialis muscle 3. Repair of right brachioradialis muscle 4. Repair of right flexor carpi radialis muscle 5. Repair of right cutaneous branch of lateral antebrachial cutaneous nerve 6. Right forearm volar and dorsal prophylactic fasciotomies  7. Right carpal tunnel release  SURGEON:  Betha LoaKevin Jackqulyn Mendel, MD  ASSISTANT:  Fransisco HertzBrian L Chen, MD  ANESTHESIA:  General.  IV FLUIDS:  Per anesthesia flow sheet.  SPECIMENS:  None.  COMPLICATIONS:  None.  TOURNIQUET TIME:  Right upper arm 23 minutes plus 66 minutes plus 89 minutes.  DISPOSITION:  Stable to PACU.  INDICATIONS:  [name] is a 23 year old right-hand dominant male who lacerated his right arm and forearm when he was trying to go through a glass door, which broke on his arm.  [facility].  He was felt to have arterial and nerve injuries.  I was consulted for management of the neuromuscular injury and Dr. Imogene Burnhen was consulted for management of the arterial injury. On examination, he had decreased sensation in the right hand.  He was unable to move the digits.  He had a tourniquet  on placed by the EMS, which had been up for approximately 1 hour at the time of evaluation.  I recommended [name] going to the operating room for exploration of the wounds and repair of damaged structures as necessary.  Risks, benefits, and alternatives of surgery were discussed including risk of blood loss, infection, damage to nerves, vessels, tendons, ligaments, bone; failure of surgery; need for additional surgery, complications with wound healing, continued pain, loss of range of motion and strength, and continued numbness.  He voiced understanding of these risks and elected to proceed.  OPERATIVE COURSE:  The patient was transported to the operating room and placed on the operating table in supine position with the right upper extremity on and arm board.  General anesthesia was induced by the anesthesiologist.  Right upper extremity was prepped and draped in normal sterile orthopaedic fashion.  Tourniquet at the proximal aspect of the extremity was inflated to 250 mmHg.  An arterial repair was performed to the brachial artery and radial artery by Dr. Imogene Burnhen, which will be dictated under a separate note.  The wounds were explored.  There was laceration to greater than 75% of the biceps muscle at the musculotendinous junction including portion of the biceps tendon.  The brachialis muscle was  lacerated.  Brachioradialis muscle was partially lacerated and partial laceration to the FCR muscle.  The median nerve was identified and was intact.  The radial nerve was felt to be deep to the brachialis muscle at the level of injury.  It was not within the zone of injury.  The lateral antebrachial cutaneous nerve was identified and was intact.  The laceration coursed towards the distal ulnar side of the humerus.  The ulnar nerve was not visible within the wound.  It was felt that it was not in the zone of injury.  The wounds were all copiously irrigated with sterile saline and debrided of  hematoma.  Once the arterial repair had been performed, fasciotomies both volarly and dorsally were performed prophylactically.  The superficial and deep volar compartments were released.  The mobile wad and extensor compartments were released dorsally.  Carpal tunnel release was performed.  Care was taken to ensure complete decompression of the transverse carpal ligament, which was the case.  The ulnar artery was identified in the palm and was intact.  The motor branch was identified and was intact.  The brachialis and biceps muscles were repaired with 2-0 Vicryl suture in a figure-of- eight fashion.  Repair of the biceps tendon to the biceps muscle was able to be performed as well.  The brachioradialis and FCR muscle lacerations were also repaired with the 2-0 Vicryl suture.  The cutaneous branch of the lateral antebrachial cutaneous nerve was repaired with an 8-0 nylon suture.  This provided light apposition of the nerve.  The carpal tunnel incision and distal aspect of the fasciotomy incisions were closed with 3-0 nylon in a horizontal mattress fashion.  The wound at the antecubital fossa was also closed with 3-0 nylon.  The remaining portions of wound were closed with inverted and interrupted Vicryl sutures in the subcutaneous tissues and the skin was closed with staples.  All wounds were dressed with sterile Xeroform, 4x4s, and ABDs and wrapped with a Kerlix bandage.  A dorsal splint was placed with the elbow at approximately 90 degrees of flexion.  This was wrapped with Kerlix and Ace bandage.  Tourniquet had been intermittently inflated during the case to aid in the arterial repair.  At the end of the case, good Dopplerable flow in the palm was achieved and the fingertips were pink with brisk capillary refill.  The operative drapes were broken down.  The patient was awoken from anesthesia safely.  He was taken to the PACU in stable condition.  He is to be admitted to  the hospital.     Betha Loa, MD     KK/MEDQ  D:  09/19/2013  T:  09/19/2013  Job:  [id number]

## 2013-09-19 NOTE — Progress Notes (Signed)
Subjective: Day of Surgery Procedure(s) (LRB): BRACHIAL ARTERY REPAIR (Right) FASCIOTOMY (Right) Patient reports pain as controlled and tolerable.    Objective: Vital signs in last 24 hours: Temp:  [97.4 F (36.3 C)-99.6 F (37.6 C)] 98.3 F (36.8 C) (07/17 1500) Pulse Rate:  [63-114] 63 (07/17 1500) Resp:  [8-24] 16 (07/17 1500) BP: (82-128)/(53-74) 105/54 mmHg (07/17 1500) SpO2:  [96 %-100 %] 96 % (07/17 1500) Weight:  [68.04 kg (150 lb)] 68.04 kg (150 lb) (07/16 2353)  Intake/Output from previous day: 07/16 0701 - 07/17 0700 In: 7057.5 [I.V.:5285; Blood:1272.5; IV Piggyback:500] Out: 1250 [Urine:1000; Blood:250] Intake/Output this shift: Total I/O In: 495 [I.V.:300; Blood:195] Out: 175 [Urine:175]   Recent Labs  09/19/13 0015 09/19/13 0329 09/19/13 0330  HGB 9.8* 8.8* 9.8*    Recent Labs  09/19/13 0015 09/19/13 0329 09/19/13 0330  WBC 10.2  --  15.4*  RBC 2.97*  --  3.06*  HCT 29.9* 26.0* 28.8*  PLT 175  --  113*    Recent Labs  09/19/13 0015 09/19/13 0329  NA 144 140  K 3.5* 4.6  CL 108  --   CO2 19  --   BUN 9  --   CREATININE 0.90  --   GLUCOSE 105*  --   CALCIUM 7.1*  --     Recent Labs  09/19/13 0015 09/19/13 0330  INR 1.09 1.60*    decreased sensation in fingertips, fingers pink.  +epl/fpl/io.  full extension digits.  dressing clean/dry/intact  Assessment/Plan: Day of Surgery Procedure(s) (LRB): BRACHIAL ARTERY REPAIR (Right) FASCIOTOMY (Right) Radial, median, ulnar nerve motor function intact.  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  Will watch.  Per patient and girlfriend dressing was tight this morning causing discomfort.  Has since been loosened with improvement of pain level.  Ok for d/c home from hand standpoint.  Maintain splint.  Follow up in office next week.  Ashton Sabine R 09/19/2013, 4:06 PM

## 2013-09-19 NOTE — ED Notes (Signed)
Pt tried to contact mother, Lupita LeashDonna but no answer. Her phone numbers are [phone] or [phone]

## 2013-09-19 NOTE — Progress Notes (Signed)
Sling applied to Right arm. Will continue to monitor.

## 2013-09-19 NOTE — Consult Note (Signed)
Late note due to urgency of procedure. [name] is an 23 y.o. male.   Chief Complaint: right arm lacerations HPI: 23 yo rhd male states a glass door broke evening of 09/18/13 lacerating right arm at level of elbow.  Brought to [facility] where he was felt to have arterial bleeding and nerve injury.  I was consulted for management of nerve injury and Dr. Bridgett Larsson was consulted for management of the vascular injury.  Patient reports no previous injury to right arm.  History reviewed. No pertinent past medical history.  History reviewed. No pertinent past surgical history.  No family history on file. Social History:  reports that he has been smoking.  He does not have any smokeless tobacco history on file. He reports that he drinks alcohol. He reports that he does not use illicit drugs.  Allergies: No Known Allergies   (Not in a hospital admission)  Results for orders placed during the hospital encounter of 09/18/13 (from the past 48 hour(s))  PREPARE FRESH FROZEN PLASMA     Status: None   Collection Time    09/18/13 11:43 PM      Result Value Ref Range   Unit Number [id number]     Blood Component Type THAWED PLASMA     Unit division 00     Status of Unit ISSUED     Unit tag comment VERBAL ORDERS PER DR OTTER     Transfusion Status OK TO TRANSFUSE     Unit Number [id number]     Blood Component Type THAWED PLASMA     Unit division 00     Status of Unit ALLOCATED     Unit tag comment VERBAL ORDERS PER DR OTTER     Transfusion Status OK TO TRANSFUSE    TYPE AND SCREEN     Status: None   Collection Time    09/19/13 12:15 AM      Result Value Ref Range   ABO/RH(D) O POS     Antibody Screen NEG     Sample Expiration 09/22/2013     Unit Number [id number]     Blood Component Type RED CELLS,LR     Unit division 00     Status of Unit ISSUED     Unit tag comment VERBAL ORDERS PER DR OTTER     Transfusion Status OK TO TRANSFUSE     Crossmatch Result COMPATIBLE     Unit Number  [id number]     Blood Component Type RED CELLS,LR     Unit division 00     Status of Unit ISSUED     Unit tag comment VERBAL ORDERS PER DR OTTER     Transfusion Status OK TO TRANSFUSE     Crossmatch Result COMPATIBLE     Unit Number [id number]     Blood Component Type RED CELLS,LR     Unit division 00     Status of Unit ISSUED     Transfusion Status OK TO TRANSFUSE     Crossmatch Result Compatible     Unit Number [id number]     Blood Component Type RED CELLS,LR     Unit division 00     Status of Unit ISSUED     Transfusion Status OK TO TRANSFUSE     Crossmatch Result Compatible     Unit Number [id number]     Blood Component Type RBC LR PHER1     Unit division 00     Status of Unit ALLOCATED  Transfusion Status OK TO TRANSFUSE     Crossmatch Result Compatible     Unit Number [id number]     Blood Component Type RED CELLS,LR     Unit division 00     Status of Unit ALLOCATED     Transfusion Status OK TO TRANSFUSE     Crossmatch Result Compatible    COMPREHENSIVE METABOLIC PANEL     Status: Abnormal   Collection Time    09/19/13 12:15 AM      Result Value Ref Range   Sodium 144  137 - 147 mEq/L   Potassium 3.5 (*) 3.7 - 5.3 mEq/L   Chloride 108  96 - 112 mEq/L   CO2 19  19 - 32 mEq/L   Glucose, Bld 105 (*) 70 - 99 mg/dL   BUN 9  6 - 23 mg/dL   Creatinine, Ser 0.90  0.50 - 1.35 mg/dL   Calcium 7.1 (*) 8.4 - 10.5 mg/dL   Total Protein 5.2 (*) 6.0 - 8.3 g/dL   Albumin 2.9 (*) 3.5 - 5.2 g/dL   AST 18  0 - 37 U/L   ALT 13  0 - 53 U/L   Alkaline Phosphatase 71  39 - 117 U/L   Total Bilirubin <0.2 (*) 0.3 - 1.2 mg/dL   GFR calc non Af Amer >90  >90 mL/min   GFR calc Af Amer >90  >90 mL/min   Comment: (NOTE)     The eGFR has been calculated using the CKD EPI equation.     This calculation has not been validated in all clinical situations.     eGFR's persistently <90 mL/min signify possible Chronic Kidney     Disease.   Anion gap 17 (*) 5 - 15  CBC      Status: Abnormal   Collection Time    09/19/13 12:15 AM      Result Value Ref Range   WBC 10.2  4.0 - 10.5 K/uL   RBC 2.97 (*) 4.22 - 5.81 MIL/uL   Hemoglobin 9.8 (*) 13.0 - 17.0 g/dL   HCT 29.9 (*) 39.0 - 52.0 %   MCV 100.7 (*) 78.0 - 100.0 fL   MCH 33.0  26.0 - 34.0 pg   MCHC 32.8  30.0 - 36.0 g/dL   RDW 12.3  11.5 - 15.5 %   Platelets 175  150 - 400 K/uL  ETHANOL     Status: Abnormal   Collection Time    09/19/13 12:15 AM      Result Value Ref Range   Alcohol, Ethyl (B) 283 (*) 0 - 11 mg/dL   Comment:            LOWEST DETECTABLE LIMIT FOR     SERUM ALCOHOL IS 11 mg/dL     FOR MEDICAL PURPOSES ONLY  PROTIME-INR     Status: None   Collection Time    09/19/13 12:15 AM      Result Value Ref Range   Prothrombin Time 14.1  11.6 - 15.2 seconds   INR 1.09  0.00 - 1.49  ABO/RH     Status: None   Collection Time    09/19/13 12:15 AM      Result Value Ref Range   ABO/RH(D) O POS    PREPARE RBC (CROSSMATCH)     Status: None   Collection Time    09/19/13 12:30 AM      Result Value Ref Range   Order Confirmation ORDER PROCESSED BY BLOOD BANK  POCT I-STAT EG7     Status: Abnormal   Collection Time    09/19/13  3:29 AM      Result Value Ref Range   pH, Ven 7.218 (*) 7.250 - 7.300   pCO2, Ven 47.4  45.0 - 50.0 mmHg   pO2, Ven 54.0 (*) 30.0 - 45.0 mmHg   Bicarbonate 19.6 (*) 20.0 - 24.0 mEq/L   TCO2 21  0 - 100 mmol/L   O2 Saturation 83.0     Acid-base deficit 8.0 (*) 0.0 - 2.0 mmol/L   Sodium 140  137 - 147 mEq/L   Potassium 4.6  3.7 - 5.3 mEq/L   Calcium, Ion 1.04 (*) 1.12 - 1.23 mmol/L   HCT 26.0 (*) 39.0 - 52.0 %   Hemoglobin 8.8 (*) 13.0 - 17.0 g/dL   Patient temperature 36.0 C     Collection site RADIAL, ALLEN'S TEST ACCEPTABLE     Sample type VENOUS    CBC     Status: Abnormal   Collection Time    09/19/13  3:30 AM      Result Value Ref Range   WBC 15.4 (*) 4.0 - 10.5 K/uL   RBC 3.06 (*) 4.22 - 5.81 MIL/uL   Hemoglobin 9.8 (*) 13.0 - 17.0 g/dL   HCT 28.8 (*)  39.0 - 52.0 %   MCV 94.1  78.0 - 100.0 fL   Comment: POST TRANSFUSION SPECIMEN   MCH 32.0  26.0 - 34.0 pg   MCHC 34.0  30.0 - 36.0 g/dL   RDW 14.4  11.5 - 15.5 %   Platelets 113 (*) 150 - 400 K/uL   Comment: REPEATED TO VERIFY     SPECIMEN CHECKED FOR CLOTS     PLATELET COUNT CONFIRMED BY SMEAR  APTT     Status: None   Collection Time    09/19/13  3:30 AM      Result Value Ref Range   aPTT 33  24 - 37 seconds  PROTIME-INR     Status: Abnormal   Collection Time    09/19/13  3:30 AM      Result Value Ref Range   Prothrombin Time 19.1 (*) 11.6 - 15.2 seconds   INR 1.60 (*) 0.00 - 1.49    Dg Elbow 2 Views Right  09/19/2013   CLINICAL DATA:  Arterial bleeding from the right arm after trauma.  EXAM: RIGHT ELBOW - 1 VIEW  COMPARISON:  None.  FINDINGS: Extensive subcutaneous gas in the forearm and distal arm, with overlying bandage. There is no radiopaque foreign body, dislocation or fracture in the frontal projection.  IMPRESSION: No fracture or definite foreign body in the frontal projection.   Electronically Signed   By: Jorje Guild M.D.   On: 09/19/2013 00:36     A comprehensive review of systems was negative.  Blood pressure 120/63, pulse 94, temperature 97.4 F (36.3 C), temperature source Oral, resp. rate 22, height 6' (1.829 m), weight 68.04 kg (150 lb), SpO2 100.00%.  General appearance: alert, cooperative and appears stated age Head: Normocephalic, without obvious abnormality, atraumatic Neck: supple, symmetrical, trachea midline Extremities: left ue: no wounds or ttp.  intact to light touch and capillary refill all digits.  +epl/fpl/io.  right ue: lacerations at antecubital fossa and proximal forearm.  tourniquet at upper arm applied by ems.  unable to feel fingers or move digits.   Pulses: 2+ and symmetric  Except right arm. Skin: Skin color, texture, turgor normal. No rashes or lesions Neurologic:  Grossly normal  Except right arm. Incision/Wound: As  above  Assessment/Plan Right arm lacerations with arterial injury and possible nerve/tendon/muscle injury.  Plan OR for exploration of wounds and repair of structures as necessary.  Also plan prophylactic fasciotomies.  Risks, benefits, and alternatives of surgery were discussed and the patient agrees with the plan of care.   Areatha Kalata R 09/19/2013, 4:16 AM

## 2013-09-19 NOTE — ED Provider Notes (Signed)
CSN: [id number]     Arrival date & time 09/18/13  2349 History   First MD Initiated Contact with Patient 09/18/13 2358     Chief Complaint  Patient presents with  . Trauma     (Consider location/radiation/quality/duration/timing/severity/associated sxs/prior Treatment) HPI 23 yo male presents to the ER via EMS as level 1 trauma.  Pt reports he put his right arm through a glass sliding door.  EMS reports arterial bleeding, tourniquet placed at 2325.  Pt is RHD.  Initial sbp 70, improved to sbp of 90 after 750 cc of iv fluids.  Pt reports tetanus is UTD.  No other injuries.  Pt unable to move arm or hand below the injury site, no sensation below injury.  Pt with 3 lacerations at right [facility] History reviewed. No pertinent past medical history. History reviewed. No pertinent past surgical history. No family history on file. History  Substance Use Topics  . Smoking status: Current Every Day Smoker  . Smokeless tobacco: Not on file  . Alcohol Use: Yes    Review of Systems  Unable to perform ROS: Acuity of condition      Allergies  Review of patient's allergies indicates no known allergies.  Home Medications   Prior to Admission medications   Not on File   BP 82/60  Pulse 72  Temp(Src) 97.4 F (36.3 C) (Oral)  Resp 19  Ht 6' (1.829 m)  Wt 150 lb (68.04 kg)  BMI 20.34 kg/m2  SpO2 100% Physical Exam  Nursing note and vitals reviewed. Constitutional: He is oriented to person, place, and time. He appears well-developed and well-nourished. He appears distressed.  HENT:  Head: Normocephalic and atraumatic.  Right Ear: External ear normal.  Left Ear: External ear normal.  Nose: Nose normal.  Mouth/Throat: Oropharynx is clear and moist.  Eyes: Conjunctivae and EOM are normal. Pupils are equal, round, and reactive to light.  Neck: Normal range of motion. Neck supple. No JVD present. No tracheal deviation present. No thyromegaly present.  Cardiovascular: Normal rate, regular  rhythm, normal heart sounds and intact distal pulses.  Exam reveals no gallop and no friction rub.   No murmur heard. Pulmonary/Chest: Effort normal and breath sounds normal. No stridor. No respiratory distress. He has no wheezes. He has no rales. He exhibits no tenderness.  Abdominal: Soft. Bowel sounds are normal. He exhibits no distension and no mass. There is no tenderness. There is no rebound and no guarding.  Musculoskeletal: Normal range of motion. He exhibits tenderness. He exhibits no edema.  3 lacerations to right AC.  Lateral 3 cm, superior 5 cm, inferior 7 cm  Tourniquet in place.  No sensation below injury site.  No movement of wrist, hand.  Lymphadenopathy:    He has no cervical adenopathy.  Neurological: He is alert and oriented to person, place, and time. He has normal reflexes. No cranial nerve deficit. He exhibits normal muscle tone. Coordination normal.  Skin: Skin is warm and dry. No rash noted. No erythema. There is pallor.  Psychiatric: He has a normal mood and affect. His behavior is normal. Judgment and thought content normal.    ED Course  Procedures (including critical care time)  CRITICAL CARE Performed by: Olivia Mackie Total critical care time: 90 min  Critical care time was exclusive of separately billable procedures and treating other patients. Critical care was necessary to treat or prevent imminent or life-threatening deterioration. Critical care was time spent personally by me on the following activities: development of  treatment plan with patient and/or surrogate as well as nursing, discussions with consultants, evaluation of patient's response to treatment, examination of patient, obtaining history from patient or surrogate, ordering and performing treatments and interventions, ordering and review of laboratory studies, ordering and review of radiographic studies, pulse oximetry and re-evaluation of patient's condition.  Labs Review Labs Reviewed    COMPREHENSIVE METABOLIC PANEL  CBC  ETHANOL  PROTIME-INR  TYPE AND SCREEN  PREPARE FRESH FROZEN PLASMA  SAMPLE TO BLOOD BANK  PREPARE RBC (CROSSMATCH)    Imaging Review No results found.   EKG Interpretation None      MDM   Final diagnoses:  Arm laceration with complication, right, initial encounter    23 yo male with lacerations to right AC.  Level 1 trauma, Dr Luisa Hartornett at bedside upon arrival.  D/w Dr Merlyn LotKuzma who requests vascular consult.  D/w Dr Imogene Burnhen.  Both will come to ED and plan to take patient to OR.  Pt with hypotension here after arrival, receiving emergency release blood.          Olivia Mackielga M Chenoah Mcnally, MD 09/19/13 [phone]

## 2013-09-19 NOTE — Op Note (Signed)
[id number] 

## 2013-09-19 NOTE — Progress Notes (Signed)
UR completed.  Kenidy Crossland, RN BSN MHA CCM Trauma/Neuro ICU Case Manager [phone]  

## 2013-09-20 DIAGNOSIS — S45119A Laceration of brachial artery, unspecified side, initial encounter: Secondary | ICD-10-CM

## 2013-09-20 DIAGNOSIS — I959 Hypotension, unspecified: Secondary | ICD-10-CM

## 2013-09-20 LAB — PREPARE FRESH FROZEN PLASMA: UNIT DIVISION: 0

## 2013-09-20 NOTE — Progress Notes (Signed)
1 Day Post-Op  Subjective: Some numbness but has motion in all his fingers.  Pain is tolerable.  Pt is in a splint with ace from hand to axilla.  Objective: Vital signs in last 24 hours: Temp:  [97.6 F (36.4 C)-99.6 F (37.6 C)] 98.3 F (36.8 C) (07/18 0622) Pulse Rate:  [63-92] 66 (07/18 0622) Resp:  [16-24] 16 (07/18 0622) BP: (101-122)/(54-66) 101/62 mmHg (07/18 0622) SpO2:  [96 %-100 %] 100 % (07/18 0622) Last BM Date: 09/18/13 960 PO  Regular diet Afebrile, VSS No labs this AM Intake/Output from previous day: 07/17 0701 - 07/18 0700 In: 1602.5 [P.O.:960; I.V.:447.5; Blood:195] Out: 1975 [Urine:1975] Intake/Output this shift:    General appearance: alert, cooperative and no distress Resp: clear to auscultation bilaterally Dressing in place from hand to axilla, He has motion in his fingers, but some ongoing numbness.  Lab Results:   Recent Labs  09/19/13 0015 09/19/13 0329 09/19/13 0330  WBC 10.2  --  15.4*  HGB 9.8* 8.8* 9.8*  HCT 29.9* 26.0* 28.8*  PLT 175  --  113*    BMET  Recent Labs  09/19/13 0015 09/19/13 0329  NA 144 140  K 3.5* 4.6  CL 108  --   CO2 19  --   GLUCOSE 105*  --   BUN 9  --   CREATININE 0.90  --   CALCIUM 7.1*  --    PT/INR  Recent Labs  09/19/13 0015 09/19/13 0330  LABPROT 14.1 19.1*  INR 1.09 1.60*     Recent Labs Lab 09/19/13 0015  AST 18  ALT 13  ALKPHOS 71  BILITOT <0.2*  PROT 5.2*  ALBUMIN 2.9*     Lipase  No results found for this basename: lipase     Studies/Results: Dg Elbow 2 Views Right  09/19/2013   CLINICAL DATA:  Arterial bleeding from the right arm after trauma.  EXAM: RIGHT ELBOW - 1 VIEW  COMPARISON:  None.  FINDINGS: Extensive subcutaneous gas in the forearm and distal arm, with overlying bandage. There is no radiopaque foreign body, dislocation or fracture in the frontal projection.  IMPRESSION: No fracture or definite foreign body in the frontal projection.   Electronically Signed   By:  Tiburcio Pea M.D.   On: 09/19/2013 00:36    Medications: . antiseptic oral rinse  15 mL Mouth Rinse BID  . enoxaparin (LOVENOX) injection  40 mg Subcutaneous Q24H  . ketorolac  15 mg Intravenous 4 times per day  . sodium chloride  1,000 mL Intravenous Once    Assessment/Plan Arm thru glass door with significant bleed, Hypotension and loss of touch and motion 09/19/13: traumatic laceration of antecubitum, hemorrhagic shock  1. s/p:  Repair of right brachial artery with interposition graft of reversed cephalic vein 2. Ligation of right cephalic vein, brachial veins, and basilic vein 3. Patch angioplasty of radial artery with cephalic vein 4. Complex repair of lacerations of right arm (~10 cm)       Fransisco Hertz, MD,   09/19/2013.  BRACHIAL ARTERY REPAIR (Right) - Brachial artery repair with interpositional graft and patch angioplasty.  FASCIOTOMY (Right) - repair of cutaneous nerve and repair of right arm muscles, and fasciotomy x 2 performed by Dr. Merlyn Lot.    Plan:  Dr. Merlyn Lot note yesterday says he can go from his standpoint with follow up next week.  Pt reports Dr. Imogene Burn wanted him to be here till Monday.  He has a splint in place and a  sling.  I will get him up some and see how he does recheck labs in AM and discuss with Dr. Derrell Lollingamirez  LOS: 2 days    Sherrie GeorgeJENNINGS,Sandhya Denherder 09/20/2013

## 2013-09-20 NOTE — Progress Notes (Signed)
Mother requested for patient to stay another day due to weakness and pain.  Ambulated in hall.

## 2013-09-20 NOTE — Progress Notes (Signed)
   VASCULAR PROGRESS NOTE  SUBJECTIVE: No complaints.  PHYSICAL EXAM: Filed Vitals:   09/19/13 1733 09/19/13 2249 09/20/13 0201 09/20/13 0622  BP:  107/63 109/58 101/62  Pulse: 67 69 70 66  Temp:  97.6 F (36.4 C) 98.2 F (36.8 C) 98.3 F (36.8 C)  TempSrc:  Oral Oral Oral  Resp:  17 16 16   Height:      Weight:      SpO2:  100% 100% 100%   Brisk palmar arch signal with the doppler  LABS: Lab Results  Component Value Date   WBC 15.4* 09/19/2013   HGB 9.8* 09/19/2013   HCT 28.8* 09/19/2013   MCV 94.1 09/19/2013   PLT 113* 09/19/2013   Lab Results  Component Value Date   INR 1.60* 09/19/2013   Active Problems:   Arm laceration   Brachial artery laceration  ASSESSMENT AND PLAN:  * 1 Day Post-Op s/p: doing well status post repair of right brachial artery.  *  Wound management per Dr. Fredrik CoveKuzma  Chris Maecie Sevcik Beeper: [phone] 09/20/2013

## 2013-09-20 NOTE — Progress Notes (Signed)
I have seen and examined the pt and agree with PA-Jenning's progress note. Pt doing well, still with some numbness in his fingers OK home per Dr. Vilinda BlanksKuzma Ok [facility]

## 2013-09-21 ENCOUNTER — Encounter (HOSPITAL_COMMUNITY): Payer: Self-pay | Admitting: General Surgery

## 2013-09-21 DIAGNOSIS — D62 Acute posthemorrhagic anemia: Secondary | ICD-10-CM | POA: Diagnosis present

## 2013-09-21 HISTORY — DX: Acute posthemorrhagic anemia: D62

## 2013-09-21 LAB — BASIC METABOLIC PANEL
ANION GAP: 13 (ref 5–15)
BUN: 5 mg/dL — AB (ref 6–23)
CHLORIDE: 98 meq/L (ref 96–112)
CO2: 27 meq/L (ref 19–32)
Calcium: 8.3 mg/dL — ABNORMAL LOW (ref 8.4–10.5)
Creatinine, Ser: 0.87 mg/dL (ref 0.50–1.35)
GFR calc Af Amer: >90 mL/min (ref >90)
GFR calc non Af Amer: >90 mL/min (ref >90)
Glucose, Bld: 105 mg/dL — ABNORMAL HIGH (ref 70–99)
Potassium: 3.4 mEq/L — ABNORMAL LOW (ref 3.7–5.3)
Sodium: 138 mEq/L (ref 137–147)

## 2013-09-21 LAB — CBC
HEMATOCRIT: 20.8 % — AB (ref 39.0–52.0)
Hemoglobin: 7.1 g/dL — ABNORMAL LOW (ref 13.0–17.0)
MCH: 31.8 pg (ref 26.0–34.0)
MCHC: 34.1 g/dL (ref 30.0–36.0)
MCV: 93.3 fL (ref 78.0–100.0)
Platelets: 82 10*3/uL — ABNORMAL LOW (ref 150–400)
RBC: 2.23 MIL/uL — AB (ref 4.22–5.81)
RDW: 14.3 % (ref 11.5–15.5)
WBC: 7.9 10*3/uL (ref 4.0–10.5)

## 2013-09-21 MED ORDER — HYDROMORPHONE HCL PF 1 MG/ML IJ SOLN
1.0000 mg | Freq: Once | INTRAMUSCULAR | Status: AC
  Administered 2013-09-21: 1 mg via INTRAVENOUS

## 2013-09-21 MED ORDER — DSS 100 MG PO CAPS: ORAL_CAPSULE | ORAL | Status: AC

## 2013-09-21 MED ORDER — TAB-A-VITE/IRON PO TABS
1.0000 | ORAL_TABLET | Freq: Every day | ORAL | Status: DC
  Administered 2013-09-21: 1 via ORAL
  Filled 2013-09-21: qty 1

## 2013-09-21 MED ORDER — DOCUSATE SODIUM 100 MG PO CAPS
200.0000 mg | ORAL_CAPSULE | Freq: Every day | ORAL | Status: DC
  Administered 2013-09-21: 200 mg via ORAL
  Filled 2013-09-21: qty 2

## 2013-09-21 MED ORDER — OXYCODONE HCL 5 MG PO TABS: 5.0000 mg | ORAL_TABLET | ORAL | Status: DC | PRN

## 2013-09-21 MED ORDER — FERROUS SULFATE 325 (65 FE) MG PO TABS
325.0000 mg | ORAL_TABLET | Freq: Every day | ORAL | Status: DC
  Filled 2013-09-21: qty 1

## 2013-09-21 MED ORDER — TAB-A-VITE/IRON PO TABS: ORAL_TABLET | ORAL | Status: AC

## 2013-09-21 MED ORDER — OXYCODONE HCL 5 MG PO TABS
5.0000 mg | ORAL_TABLET | ORAL | Status: DC | PRN
  Administered 2013-09-21: 15 mg via ORAL
  Filled 2013-09-21: qty 3

## 2013-09-21 MED ORDER — ACETAMINOPHEN 500 MG PO TABS: ORAL_TABLET | ORAL | Status: AC

## 2013-09-21 MED ORDER — FERROUS SULFATE 325 (65 FE) MG PO TABS: 325.0000 mg | ORAL_TABLET | Freq: Every day | ORAL | Status: AC

## 2013-09-21 MED ORDER — IBUPROFEN 600 MG PO TABS: 600.0000 mg | ORAL_TABLET | Freq: Four times a day (QID) | ORAL | Status: DC | PRN

## 2013-09-21 MED ORDER — ACETAMINOPHEN 500 MG PO TABS
1000.0000 mg | ORAL_TABLET | Freq: Four times a day (QID) | ORAL | Status: DC
  Administered 2013-09-21: 1000 mg via ORAL
  Filled 2013-09-21: qty 2

## 2013-09-21 NOTE — Progress Notes (Signed)
2 Days Post-Op  Subjective: He is having allot of pain, trouble sleeping, requiring more pain medicine. He is also having  allot of anxiety according to his girlfriend.  H/H is down also.  Objective: Vital signs in last 24 hours: Temp:  [98.2 F (36.8 C)-99.2 F (37.3 C)] 98.5 F (36.9 C) (07/19 0520) Pulse Rate:  [58-75] 58 (07/19 0520) Resp:  [14-16] 16 (07/19 0520) BP: (100-111)/(50-59) 109/50 mmHg (07/19 0520) SpO2:  [99 %-100 %] 100 % (07/19 0520) Last BM Date: 09/18/13 Regular diet TM 99.2, VSS K+ low, 7.1 today Hemoglobin Intake/Output from previous day: 07/18 0701 - 07/19 0700 In: 480 [P.O.:480] Out: 550 [Urine:550] Intake/Output this shift:    General appearance: alert, cooperative and tired, he just went to sleep according to his girlfriend. Tha arm is dressed, and the hand is warm.  He reports no feeling in the thumb and 5th fingers, some sensation in the 2nd, 3rd and 4th fingers.  Grip is about the same as yesterday. He is describing more pain in the upper arm around the bicep and shoulder.  Lab Results:   Recent Labs  09/19/13 0330 09/21/13 0411  WBC 15.4* 7.9  HGB 9.8* 7.1*  HCT 28.8* 20.8*  PLT 113* 82*    BMET  Recent Labs  09/19/13 0015 09/19/13 0329 09/21/13 0411  NA 144 140 138  K 3.5* 4.6 3.4*  CL 108  --  98  CO2 19  --  27  GLUCOSE 105*  --  105*  BUN 9  --  5*  CREATININE 0.90  --  0.87  CALCIUM 7.1*  --  8.3*   PT/INR  Recent Labs  09/19/13 0015 09/19/13 0330  LABPROT 14.1 19.1*  INR 1.09 1.60*     Recent Labs Lab 09/19/13 0015  AST 18  ALT 13  ALKPHOS 71  BILITOT <0.2*  PROT 5.2*  ALBUMIN 2.9*     Lipase  No results found for this basename: lipase     Studies/Results: No results found.  Medications: . enoxaparin (LOVENOX) injection  40 mg Subcutaneous Q24H  . ketorolac  15 mg Intravenous 4 times per day  . sodium chloride  1,000 mL Intravenous Once    Assessment/Plan Arm thru glass door with  significant bleed, Hypotension and loss of touch and motion 09/19/13:  traumatic laceration of antecubitum, hemorrhagic shock  1. s/p: Repair of right brachial artery with interposition graft of reversed cephalic vein 2. Ligation of right cephalic vein, brachial veins, and basilic vein 3. Patch angioplasty of radial artery with cephalic vein 4. Complex repair of lacerations of right arm (~10 cm) Fransisco HertzBrian L Chen, MD, 09/19/2013.  BRACHIAL ARTERY REPAIR (Right) - Brachial artery repair with interpositional graft and patch angioplasty.  FASCIOTOMY (Right) - repair of cutaneous nerve and repair of right arm muscles, and fasciotomy x 2 performed by Dr. Merlyn LotKuzma.  Post hemorrhagic anemia   Plan:  Discussed with Dr. Merlyn LotKuzma, he is OK with pt going home.  Since he was having more pain, Dr. Edilia Boickson and I changed the dressing.  He had some minor bleeding that was old.  But he has new dressing and Xeroform over the incision site.  The arm is not swollen and blood flow is good to the hand. Main issue now is pain control and anemia.  I will start him on an MVI and Fe. Increase his PO tylenol, and oxycodone, switch from IV to oral NSAID   LOS: 3 days    Giovanny Dugal 09/21/2013

## 2013-09-21 NOTE — Progress Notes (Signed)
   VASCULAR PROGRESS NOTE  SUBJECTIVE: complains of his dressing on the arm being too tight.  PHYSICAL EXAM: Filed Vitals:   09/20/13 2100 09/21/13 0118 09/21/13 0520 09/21/13 0923  BP: 100/52 111/55 109/50 100/55  Pulse: 62 60 58 57  Temp: 98.2 F (36.8 C) 99.2 F (37.3 C) 98.5 F (36.9 C) 97.9 F (36.6 C)  TempSrc: Oral Oral Oral Oral  Resp: 16 16 16 16   Height:      Weight:      SpO2: 100% 100% 100% 100%   Dressing changed this morning. Incisions look fine. Brisk palm are arch signal with the Doppler.  LABS: Lab Results  Component Value Date   WBC 7.9 09/21/2013   HGB 7.1* 09/21/2013   HCT 20.8* 09/21/2013   MCV 93.3 09/21/2013   PLT 82* 09/21/2013   Lab Results  Component Value Date   CREATININE 0.87 09/21/2013   Lab Results  Component Value Date   INR 1.60* 09/19/2013   CBG (last 3)  No results found for this basename: GLUCAP,  in the last 72 hours  Active Problems:   Arm laceration   Brachial artery laceration   Anemia, posthemorrhagic, acute   ASSESSMENT AND PLAN:  * 2 Days Post-Op s/p: repair of right brachial artery.   *  Okay for discharge from a vascular standpoint.  Cari CarawayChris Dickson Beeper: [phone]: [phone] 09/21/2013

## 2013-09-21 NOTE — Discharge Instructions (Signed)
Fasciotomy Fasciotomy is surgery on the fiber-like connective tissue, called fascia, that covers muscles. The procedure reduces the pressure that has built up inside the fascia in a specific area of your body. This is often the lower part of your legs or arms. This procedure is a recommended treatment for compartment syndrome. This is a condition where pressure has built up inside fascia. This can cause severe pain and swelling. It can be hard to move an arm or leg that is affected. Also, blood flow in that part of the body can become blocked. If compartment syndrome is not treated, nerve damage could result. However, a fasciotomy can relieve the pain and restore blood flow.  Fasciotomy is also used to treat plantar fasciitis. In that condition, fascia on the bottom of the feet has become inflamed (the body's way of reacting to injury or infection). Fasciotomy can be either an emergency or planned procedure. LET YOUR CAREGIVER KNOW ABOUT:   Any allergies.  All medicine you are taking, including:  Herbs, eyedrops, over-the-counter medicine and creams.  Blood thinners (anticoagulants), aspirin or other drugs that could affect blood clotting.  Use of steroids (by mouth or as creams).  Previous problems with anesthetics, including local anesthetics.  Possibility of pregnancy, if this applies.  Any history of blood clots.  Any history of bleeding or other blood problems.  Previous surgery.  Smoking history.  Other health problems. RISKS AND COMPLICATIONS  Short-term possibilities include:  Excessive bleeding.  Pain.  Total or partial loss of feeling at the wound site.  Hematoma, a pooling of blood in the wound.  Infection at the surgery site. If a skin graft is done, the site of the skin that was taken to cover the wound also could become infected.  Slow healing. Longer-term possibilities include:  Scarring.  Skin damage.  A return of the condition that led to  fasciotomy.  A change in the muscles where the fascia was cut.  Damage to blood vessels in the area. BEFORE THE PROCEDURE   Do not use aspirin or non-steroidal anti-inflammatory drugs (NSAIDs) for pain relief before the surgery. This includes over-the-counter drugs such as ibuprofen and naproxen as well as prescription drugs. Ask your healthcare provider how you might manage pain without increasing surgery risks.  Do not take vitamin E for two weeks before the surgery.  If you take blood-thinning medicine, ask your healthcare provider about when you should stop taking them.  You will be asked to stop eating and drinking fluids about 8 hours before your procedure.  You might be asked to shower or wash with a special antibacterial soap before the procedure.  Make sure you arrive at least an hour before your procedure, or at whatever time your surgeon recommends. This will give you time to check in and fill out any needed paperwork.  If your surgery will be an outpatient procedure, you will be able to go home the same day. Make arrangements in advance for someone to drive you home. PROCEDURE The exact procedure will depend on the location of the affected fascia. You will be asleep (under general anesthesia) or just the location near the surgical site will be numbed (regional anesthesia). Ask your surgeon about the details for your procedure.  The area of the fascia will be cleaned and sterilized. The surgeon will make a cut (incision) or several incisions to separate the affected fascia.   The wound will then be cleaned and, often, it will then be closed. Stitches might  be used to close the wound.  Or, the surgeon will use skin stretching devices or skins grafts to close it.  Sometimes the wound is not closed immediately. Instead, a dressing is used to keep the wound clean while the area of the fascia heals. The wound is then closed later, either in the surgeon's office or during a follow-up  surgery. AFTER THE PROCEDURE Sometimes fasciotomy is an outpatient procedure. That means you can go home the same day. Other times it requires an overnight stay in the hospital. Ask your surgeon what will happen in your case.   While you are recovering in the clinic or hospital, be sure to tell your caregivers if you are in pain.  Before you are sent home, you will be shown how to care for the area around the cut (incision) that was made during surgery.  The part of your body where the fasciotomy was done (a foot, for example) may need to be not moved (immobilized) for about 24 hours. Ask your healthcare provider what you should and should not do during this time.  Then, you should be able to begin gentle exercises. You will need to return for a follow-up visit to have the dressing changed. At that point, the surgeon will decide whether anything more needs to be done to close any open wounds.  Physical or occupational therapy might be recommended. A return to normal functioning could take two to three months. Document Released: 05/19/2008 Document Revised: 05/15/2011 Document Reviewed: 05/19/2008 [facility] Patient Information 2015 [place], [place]. This information is not intended to replace advice given to you by your health care provider. Make sure you discuss any questions you have with your health care provider.  1.  Repair of right brachial artery with interposition graft of reversed cephalic vein 2. Ligation of right cephalic vein, brachial veins, and basilic vein 3. Patch angioplasty of radial artery with cephalic vein 4. Complex repair of lacerations of right arm (~10 cm) 5.  A  bypass surgery is a surgery to go around your lacerated artery in the arm by using a blood vessel or a graft. Arteries are very important. They carry oxygen and nutrients to the body. The brachial and radial  Arteries  Are in the your arm.. It is the main artery that carries blood to your arm and hand.   LET  YOUR CAREGIVER KNOW ABOUT:  Any allergies to medications, food or latex.  All medications you are taking, including:  All prescription medicines.  Over the counter medicines.  Topical skin creams.  Eye-drops, vitamins or herbs.  Previous problems with anesthesia or numbing medicine.  Problems with your heart, lungs or kidneys.  Any history of blood clots or bleeding problems.  Previous surgery.  Other health problems.  Possibility of pregnancy, if possible. RISKS AND COMPLICATIONS  Problems after surgery (complications) can occur. Possibilities include:  Bleeding.  Loss of pulses in your surgical leg due to a blood clot.  Blood clot in the graft site.  Failure of the graft.  Infection.   AFTER THE PROCEDURE  .  You will be given pain medicine to keep you comfortable.  You may have ice packs over the surgical area. This can help reduce swelling and pain.  Your surgical site will be checked often after your surgery. This is to make sure you have good blood flow in your arm and hand  Caregivers will feel for a heartbeat (pulse) on your hand. Your arm will also be checked for  changes in color, temperature or for decreased sensation.  To prevent blood clots in your legs, you will begin activity a few hours after your surgery.  You may stay in the ICU for less than a day. From there, you will go to a regular hospital room.  When it is time for you to go home, you will be given discharge instructions. You will be shown how to care for your incisions. Ask when your incision site can get wet. Document Released: 12/18/2008 Document Revised: 05/15/2011 Document Reviewed: 12/18/2008 [facility] Patient Information 2015 [place], [place]. This information is not intended to replace advice given to you by your health care provider. Make sure you discuss any questions you have with your health care provider.  Anemia, Nonspecific  (yours is from blood loss with your  laceration) This is why I put you on iron. Anemia is a condition in which the concentration of red blood cells or hemoglobin in the blood is below normal. Hemoglobin is a substance in red blood cells that carries oxygen to the tissues of the body. Anemia results in not enough oxygen reaching these tissues.  CAUSES  Common causes of anemia include:   Excessive bleeding.   Bleeding may be internal or external. HIV, AIDS, and their treatments.  Spleen problems that increase red blood cell destruction.  Blood disorders.  Excess destruction of red blood cells due to infection, medicines, and autoimmune disorders. SIGNS AND SYMPTOMS   Minor weakness.   Dizziness.   Headache.  Palpitations.   Shortness of breath, especially with exercise.   Paleness.  Cold sensitivity.  Indigestion.  Nausea.  Difficulty sleeping.  Difficulty concentrating. Symptoms may occur suddenly or they may develop slowly.  DIAGNOSIS  Additional blood tests are often needed. These help your health care provider determine the best treatment. Your health care provider will check your stool for blood and look for other causes of blood loss.  TREATMENT  Treatment varies depending on the cause of the anemia. Treatment can include:   Supplements of iron, vitamin B12, or folic acid.   Hormone medicines.   A blood transfusion. This may be needed if blood loss is severe.   Hospitalization. This may be needed if there is significant continual blood loss.   Dietary changes.  Spleen removal. HOME CARE INSTRUCTIONS Keep all follow-up appointments. It often takes many weeks to correct anemia, and having your health care provider check on your condition and your response to treatment is very important. SEEK IMMEDIATE MEDICAL CARE IF:   You develop extreme weakness, shortness of breath, or chest pain.   You become dizzy or have trouble concentrating.  You develop heavy vaginal bleeding.   You  develop a rash.   You have bloody or black, tarry stools.   You faint.   You vomit up blood.   You vomit repeatedly.   You have abdominal pain.  You have a fever or persistent symptoms for more than 2-3 days.   You have a fever and your symptoms suddenly get worse.   You are dehydrated.  MAKE SURE YOU:  Understand these instructions.  Will watch your condition.  Will get help right away if you are not doing well or get worse. Document Released: 03/30/2004 Document Revised: 10/23/2012 Document Reviewed: 08/16/2012 [facility] Patient Information 2015 [place], [place]. This information is not intended to replace advice given to you by your health care provider. Make sure you discuss any questions you have with your health care provider.

## 2013-09-21 NOTE — Progress Notes (Signed)
Discharge instructions and prescription given to mother with [employer]. IVs removed per order. Discharged via wheelchair to mother's care. Trina Aoarla Aleksis Jiggetts, RN

## 2013-09-21 NOTE — Progress Notes (Signed)
C/o shoulder pain States getting some sensation back in middle three fingers  Arm in sling, ace wrap Good cap refill. Some movement throughout. Sensation varies  ABL anemia Traumatic arm laceration  Normotensive, not tachy so believe he is not actively bleeding. Incision dry.   Can go home  F/u with vascular and hand surgery Discussed disability paperwork with caregiver Advised he can not work until seen by Dr Shara BlazingKuzma  Fae Blossom M. Andrey CampanileWilson, MD, FACS General, Bariatric, & Minimally Invasive Surgery [facility], [place]

## 2013-09-22 LAB — TYPE AND SCREEN
ABO/RH(D): O POS
Antibody Screen: NEGATIVE
UNIT DIVISION: 0
UNIT DIVISION: 0
UNIT DIVISION: 0
UNIT DIVISION: 0
Unit division: 0
Unit division: 0

## 2013-09-22 MED FILL — Thrombin For Soln 20000 Unit: CUTANEOUS | Qty: 1 | Status: AC

## 2013-09-22 NOTE — Discharge Summary (Signed)
Physician Discharge Summary  Patient ID: [name] MRN: [id number] DOB/AGE: 1990-07-26 23 y.o.  Admit date: 09/18/2013 Discharge date: 09/21/2013  Admission Diagnoses:    1.  Arm thru glass door with significant bleed, 2.   Loss of touch and motion 09/19/13:  3.  Traumatic laceration of antecubitum, 4.  Hemorrhagic shock   Discharge Diagnoses: 1.  Arm thru glass door with significant bleed, 2.   Loss of touch and motion 09/19/13:  3.  Traumatic laceration of antecubitum, 4.  Hemorrhagic shock  5.  Post op hemorrhagic anemia.  Active Problems:   Arm laceration   Brachial artery laceration   Anemia, posthemorrhagic, acute   PROCEDURES:   Hospital Course:   Trauma admission:  Patient put his right arm through glass door. Pulsatile bleeding at scene. Tournequet placed at 2325 at scene. Hypotensive into the 24 's. No other injuries. Cannot move or feel hand. Received 750 cc in field.  Trauma Injury location: shoulder/arm            Injury location detail: R elbow Incident location: home  Pt was admitted to trauma with right arm laceration at the elbow, with loss of sensation and pulsatile bleeding at the antecubital fossa.  He was seen and taken to the OR by Dr. Leonides Sake Vascular surgery.  He had greater than 500 ml of blood loss in transport and a tourniquet was placed. Pt was in hemorrhagic shock in the ED.  Dr.Chen's  Finding included:  1. Transection of brachial artery and veins 2. Partial transection of anterior wall of radial artery 3. Transection of cephalic and basilic veins 4. Transection of lateral cutaneous nerve 5. Intact collateral flow in forearm 6. Faintly palpable radial pulse at end of case 7. Dopplerable ulnar artery signal 8. Dopplerable palmar arch  Procedures by Dr. Imogene Burn:  1. Repair of right brachial artery with interposition graft of reversed cephalic vein. 2.  Ligation of right cephalic vein, brachial veins, and basilic vein 3.  Patch  angioplasty of radial artery with cephalic vein 4.  Complex repair of lacerations of right arm (~10 cm) He was also seen by Dr. Merlyn Lot in the ED, he assisted with his care and nerve injury.  His procedures included:   Repair of biceps muscle and tendon, repair of brachialis  muscle, repair of brachioradialis muscle, repair of flexor carpi  radialis muscle, repair of cutaneous branch of lateral brachial  cutaneous nerve, prophylactic fasciotomies, and carpal tunnel release.  Post op he had a good deal of pain, good flow to his hand and decreased sensation likely to to neuropraxia.  He got 6 units of blood products including FFP as best I can tell from the [facility] record. He made slow progress with the pain issues, he did start to have some return of sensation to the 2nd, 3rd and 4th fingers prior to discharge.  Despite the large volume of blood transfused he did drop his H/H to 09/04/18.8.  Because of the drop in H/H and his pain/discomfort, Dr. Edilia Bo and I changed his dressing day of discharge.  There was no swelling or hematoma.  All the incisions were healing nicely.  We replaced xeroform gauze, and ABD padding. He was placed back in the splint and he did well with this.  His pain level is better and he was ready for discharge at this point.  He is to follow up with Dr. Merlyn Lot in the coming week and follow up with Dr. Imogene Burn also.  I have ask  him to see his PCP to follow up on his anemia.  I have started him on iron and a MVI for this.    Condition on d/c:  Improving   Disposition: 01-Home or Self Care     Medication List         acetaminophen 500 MG tablet  Commonly known as:  TYLENOL  Do this for a few days to keep something in your system, because you are going to continue having pain.  This should help some.     ALPRAZolam 1 MG tablet  Commonly known as:  XANAX  Take 1 mg by mouth daily as needed for anxiety.     DSS 100 MG Caps  You can buy this over the counter and use it to help keep  your stools soft.  Eating allot of fiber and lots of fluids will help also.     ferrous sulfate 325 (65 FE) MG tablet  Take 1 tablet (325 mg total) by mouth daily with supper.     multivitamins with iron Tabs tablet  Women's One per day Multivitamin with iron, generic choice is fine.     oxyCODONE 5 MG immediate release tablet  Commonly known as:  Oxy IR/ROXICODONE  Take 1-3 tablets (5-15 mg total) by mouth every 4 (four) hours as needed for severe pain.           Follow-up Information   Follow up with Tami RibasKUZMA,KEVIN R, MD In 1 week.   Specialty:  Orthopedic Surgery   Contact information:   [address] [place] [address] [phone]       Schedule an appointment as soon as possible for a visit with Nilda SimmerHEN,BRIAN LIANG-YU, MD. (Make an appointment for 1-2 weeks.)    Specialty:  Vascular Surgery   Contact information:   [address] [place] [address] [phone]       Follow up with You should contact a primary care doctor to follow your anemia, and anxiety..      SignedSherrie George: Machaela Caterino 09/22/2013, 9:18 AM

## 2013-09-23 ENCOUNTER — Encounter (HOSPITAL_COMMUNITY): Payer: Self-pay | Admitting: Vascular Surgery

## 2013-09-23 NOTE — Discharge Summary (Signed)
[name] M. Alan Pendry, MD, FACS General, Bariatric, & Minimally Invasive Surgery [facility] [place]  

## 2013-10-02 ENCOUNTER — Encounter: Payer: Self-pay | Admitting: Vascular Surgery

## 2013-10-03 ENCOUNTER — Ambulatory Visit: Payer: Self-pay | Admitting: Vascular Surgery

## 2013-10-03 ENCOUNTER — Encounter: Payer: Self-pay | Admitting: Vascular Surgery

## 2013-10-03 ENCOUNTER — Ambulatory Visit (INDEPENDENT_AMBULATORY_CARE_PROVIDER_SITE_OTHER): Payer: Self-pay | Admitting: Vascular Surgery

## 2013-10-03 VITALS — BP 102/60 | HR 76 | Temp 98.5°F | Resp 14 | Ht 70.0 in | Wt 139.0 lb

## 2013-10-03 DIAGNOSIS — Z5189 Encounter for other specified aftercare: Secondary | ICD-10-CM

## 2013-10-03 DIAGNOSIS — S45111D Laceration of brachial artery, right side, subsequent encounter: Secondary | ICD-10-CM

## 2013-10-03 DIAGNOSIS — Z48812 Encounter for surgical aftercare following surgery on the circulatory system: Secondary | ICD-10-CM

## 2013-10-03 NOTE — Progress Notes (Signed)
[facility] POST OPERATIVE OFFICE NOTE  CC:  F/u for surgery  HPI:  This is a 23 y.o. male who is s/p repair of right brachial artery with interposition graft with reversed cephalic vein on 1/61/09.  He presents today for f/u.  He states that he saw Dr. Merlyn Lot this am and his staples were removed.  He states that his hand is improving.  No Known Allergies  Current Outpatient Prescriptions  Medication Sig Dispense Refill  . acetaminophen (TYLENOL) 500 MG tablet Do this for a few days to keep something in your system, because you are going to continue having pain.  This should help some.  30 tablet  0  . ALPRAZolam (XANAX) 1 MG tablet Take 1 mg by mouth daily as needed for anxiety.      . docusate sodium 100 MG CAPS You can buy this over the counter and use it to help keep your stools soft.  Eating allot of fiber and lots of fluids will help also.  10 capsule  0  . ferrous sulfate 325 (65 FE) MG tablet Take 1 tablet (325 mg total) by mouth daily with supper.    3  . Multiple Vitamins-Iron (MULTIVITAMINS WITH IRON) TABS tablet Women's One per day Multivitamin with iron, generic choice is fine.    0  . oxyCODONE-acetaminophen (PERCOCET) 10-325 MG per tablet Take 1 tablet by mouth every 6 (six) hours as needed for pain (Per Dr. Merlyn Lot).       No current facility-administered medications for this visit.     ROS:  See HPI  Physical Exam:  Filed Vitals:   10/03/13 1258  BP: 102/60  Pulse: 76  Temp: 98.5 F (36.9 C)  Resp: 14    Incision:  bandaged Extremities:  3/5 right hand grip.  Sensation in fingers still decreased, but improved from prior to leaving hospital.  + doppler signal right radial and palmer artery doppler signal.   A/P:  This is a 23 y.o. male here for  1. Repair of right brachial artery with interposition graft of reversed cephalic vein 2. Ligation of right cephalic vein, brachial veins, and basilic vein 3. Patch angioplasty of radial artery with  cephalic vein 4. Complex repair of lacerations of right arm (~10 cm) And 1. Repair of right biceps muscle and tendon  2. Repair of right brachialis muscle  3. Repair of right brachioradialis muscle  4. Repair of right flexor carpi radialis muscle  5. Repair of right cutaneous branch of lateral antebrachial cutaneous nerve  6. Right forearm volar and dorsal prophylactic fasciotomies  7. Right carpal tunnel release   -pt doing well today.  His arm is bandaged per ortho.  He does have a + doppler signal in his right radial artery as well as his palmer arch. -his grip is somewhat improved from post op.  His sensation is also somewhat improved.  -pt encouraged to eat healthy and stop smoking to preserve bypass for longevity. -pt will see Dr. Imogene Burn back in 3 months to follow up on his progress with an arterial duplex of his right arm -he has appt with ortho on 10/06/13. -hopefully, he will be able to start PT/OT soon.   Doreatha Massed, PA-C [facility] [phone]  Clinic MD:  Pt seen and examined with Dr. Imogene Burn  Addendum  I have independently interviewed and examined the patient, and I agree with the physician assistant's findings.  Strong palmar signal with ulnar > radial signals.  Wound care of right arm per Hand Surgery.  Pt starting OT soon for his arm.  Pt will need longitudinal surveillance of the vein bypass with RUE arterial duplex q3 months initially.  Leonides SakeBrian Chen, MD [facility] [place] Office: [phone] Pager: [phone]  10/03/2013, 2:16 PM

## 2013-10-07 DIAGNOSIS — Z0279 Encounter for issue of other medical certificate: Secondary | ICD-10-CM

## 2014-01-09 ENCOUNTER — Other Ambulatory Visit (HOSPITAL_COMMUNITY): Payer: Self-pay

## 2014-01-09 ENCOUNTER — Ambulatory Visit: Payer: Self-pay | Admitting: Vascular Surgery

## 2014-01-15 ENCOUNTER — Encounter: Payer: Self-pay | Admitting: Vascular Surgery

## 2014-01-16 ENCOUNTER — Other Ambulatory Visit (HOSPITAL_COMMUNITY): Payer: Self-pay

## 2014-01-16 ENCOUNTER — Ambulatory Visit: Payer: Self-pay | Admitting: Vascular Surgery

## 2016-01-29 IMAGING — CR DG ELBOW 2V*R*
1 series · 1 of 1 positions shown · non-contrast
Comparison: None.

CLINICAL DATA: Arterial bleeding from the right arm after trauma.

EXAM:
RIGHT ELBOW - 1 VIEW

[AP]
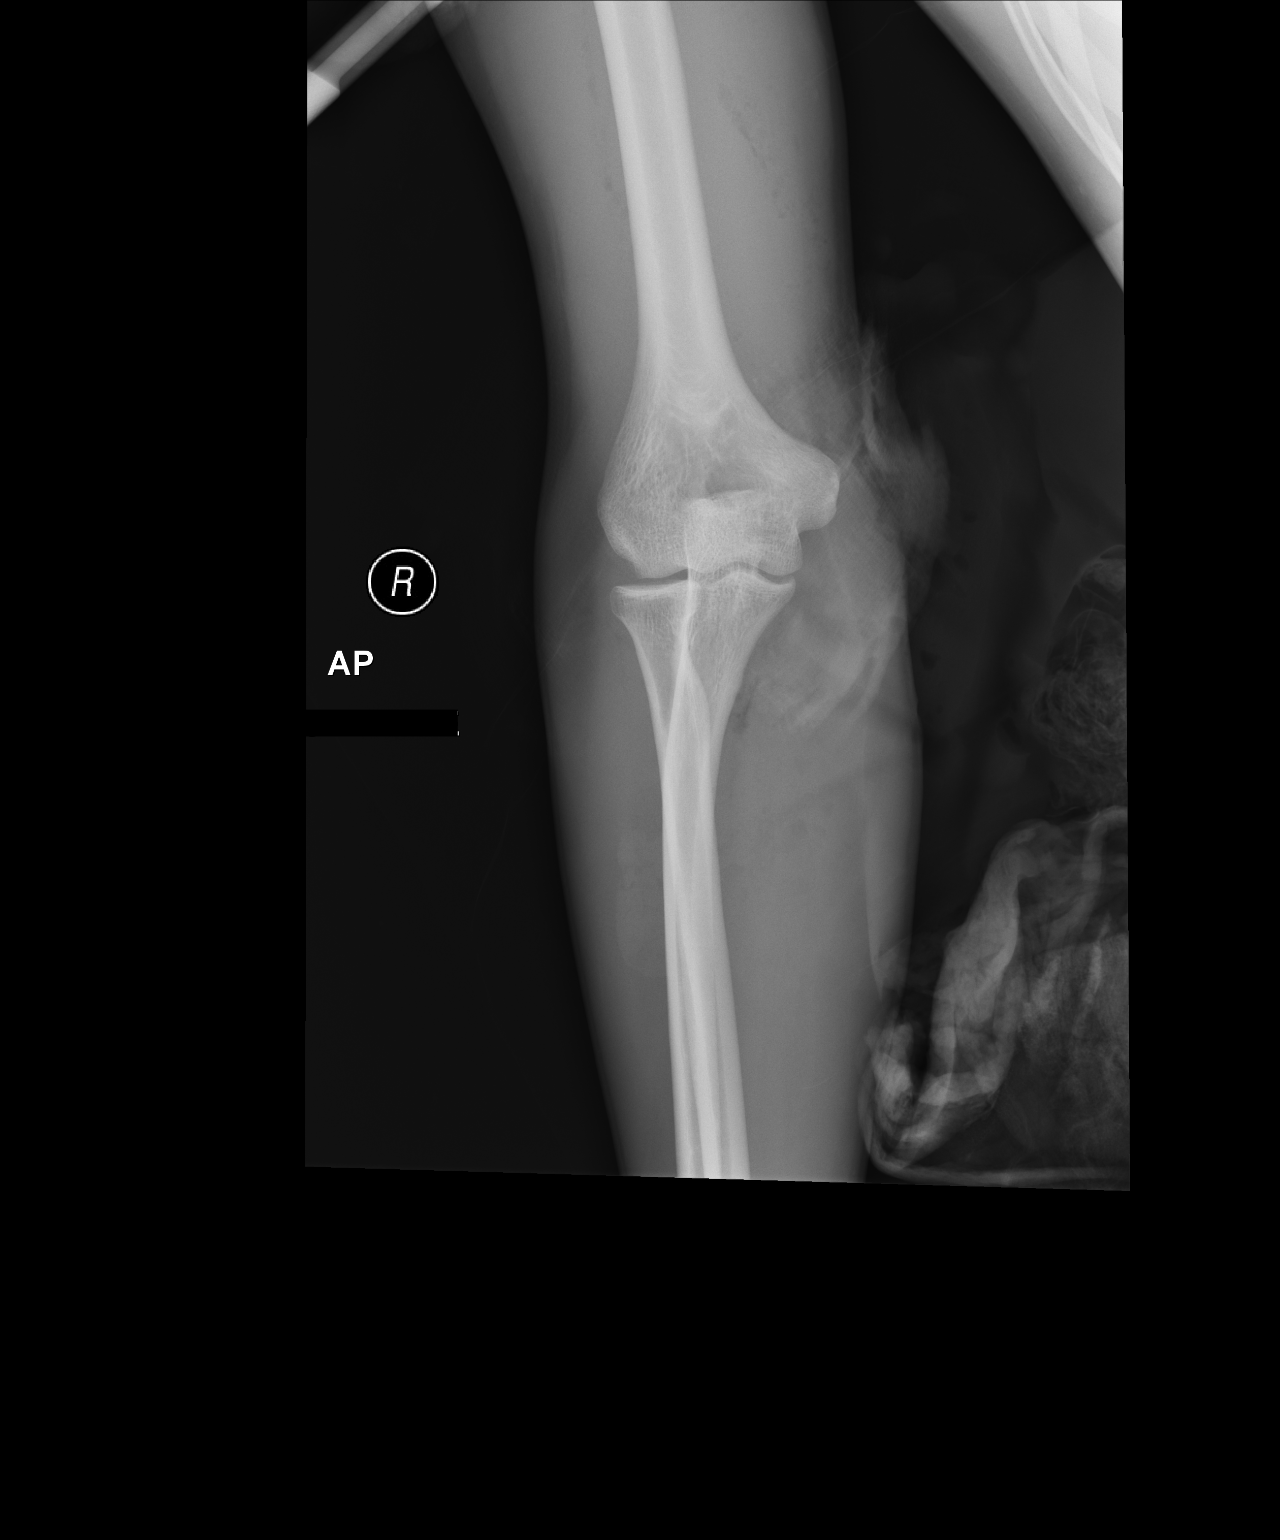

[1 of 1 positions shown; findings below may reference images not displayed]

FINDINGS: Extensive subcutaneous gas in the forearm and distal arm, with
overlying bandage. There is no radiopaque foreign body, dislocation
or fracture in the frontal projection.
IMPRESSION: No fracture or definite foreign body in the frontal projection.

## 2019-01-01 ENCOUNTER — Other Ambulatory Visit: Payer: Self-pay

## 2019-01-01 DIAGNOSIS — Z20822 Contact with and (suspected) exposure to covid-19: Secondary | ICD-10-CM

## 2019-01-02 LAB — NOVEL CORONAVIRUS, NAA: SARS-CoV-2, NAA: NOT DETECTED
